# Patient Record
Sex: Female | Born: 1970 | Race: Black or African American | Hispanic: No | Marital: Married | Smoking: Never smoker
Health system: Southern US, Community
[De-identification: ages and names within clinical notes are randomized; demographics above are authoritative.]

## PROBLEM LIST (undated history)

## (undated) DIAGNOSIS — D649 Anemia, unspecified: Secondary | ICD-10-CM

## (undated) DIAGNOSIS — B20 Human immunodeficiency virus [HIV] disease: Secondary | ICD-10-CM

## (undated) DIAGNOSIS — Z5189 Encounter for other specified aftercare: Secondary | ICD-10-CM

## (undated) DIAGNOSIS — E119 Type 2 diabetes mellitus without complications: Secondary | ICD-10-CM

## (undated) DIAGNOSIS — K219 Gastro-esophageal reflux disease without esophagitis: Secondary | ICD-10-CM

## (undated) HISTORY — DX: Type 2 diabetes mellitus without complications: E11.9

---

## 2001-11-21 ENCOUNTER — Emergency Department (HOSPITAL_COMMUNITY): Admission: EM | Admit: 2001-11-21 | Discharge: 2001-11-21 | Payer: Self-pay | Admitting: *Deleted

## 2002-12-28 ENCOUNTER — Encounter: Payer: Self-pay | Admitting: *Deleted

## 2002-12-28 ENCOUNTER — Inpatient Hospital Stay (HOSPITAL_COMMUNITY): Admission: AD | Admit: 2002-12-28 | Discharge: 2002-12-28 | Payer: Self-pay | Admitting: *Deleted

## 2003-01-08 ENCOUNTER — Inpatient Hospital Stay (HOSPITAL_COMMUNITY): Admission: AD | Admit: 2003-01-08 | Discharge: 2003-01-08 | Payer: Self-pay | Admitting: Obstetrics & Gynecology

## 2003-01-13 ENCOUNTER — Inpatient Hospital Stay (HOSPITAL_COMMUNITY): Admission: AD | Admit: 2003-01-13 | Discharge: 2003-01-13 | Payer: Self-pay | Admitting: *Deleted

## 2003-01-13 ENCOUNTER — Encounter: Payer: Self-pay | Admitting: *Deleted

## 2003-01-19 ENCOUNTER — Inpatient Hospital Stay (HOSPITAL_COMMUNITY): Admission: AD | Admit: 2003-01-19 | Discharge: 2003-01-23 | Payer: Self-pay | Admitting: Obstetrics and Gynecology

## 2003-01-26 ENCOUNTER — Inpatient Hospital Stay (HOSPITAL_COMMUNITY): Admission: AD | Admit: 2003-01-26 | Discharge: 2003-01-29 | Payer: Self-pay | Admitting: Obstetrics and Gynecology

## 2003-02-11 ENCOUNTER — Inpatient Hospital Stay (HOSPITAL_COMMUNITY): Admission: AD | Admit: 2003-02-11 | Discharge: 2003-02-12 | Payer: Self-pay | Admitting: Family Medicine

## 2003-02-23 ENCOUNTER — Encounter: Admission: RE | Admit: 2003-02-23 | Discharge: 2003-02-23 | Payer: Self-pay | Admitting: *Deleted

## 2003-03-09 ENCOUNTER — Ambulatory Visit (HOSPITAL_COMMUNITY): Admission: RE | Admit: 2003-03-09 | Discharge: 2003-03-09 | Payer: Self-pay | Admitting: *Deleted

## 2003-03-09 ENCOUNTER — Other Ambulatory Visit: Admission: RE | Admit: 2003-03-09 | Discharge: 2003-03-09 | Payer: Self-pay | Admitting: Obstetrics & Gynecology

## 2003-03-09 ENCOUNTER — Encounter: Admission: RE | Admit: 2003-03-09 | Discharge: 2003-03-09 | Payer: Self-pay | Admitting: *Deleted

## 2003-03-09 ENCOUNTER — Encounter (INDEPENDENT_AMBULATORY_CARE_PROVIDER_SITE_OTHER): Payer: Self-pay

## 2003-03-23 ENCOUNTER — Encounter: Admission: RE | Admit: 2003-03-23 | Discharge: 2003-03-23 | Payer: Self-pay | Admitting: *Deleted

## 2003-04-06 ENCOUNTER — Encounter: Admission: RE | Admit: 2003-04-06 | Discharge: 2003-04-06 | Payer: Self-pay | Admitting: *Deleted

## 2003-05-04 ENCOUNTER — Encounter: Admission: RE | Admit: 2003-05-04 | Discharge: 2003-05-04 | Payer: Self-pay | Admitting: *Deleted

## 2003-05-04 ENCOUNTER — Ambulatory Visit (HOSPITAL_COMMUNITY): Admission: RE | Admit: 2003-05-04 | Discharge: 2003-05-04 | Payer: Self-pay | Admitting: *Deleted

## 2003-05-18 ENCOUNTER — Encounter: Admission: RE | Admit: 2003-05-18 | Discharge: 2003-05-18 | Payer: Self-pay | Admitting: *Deleted

## 2003-05-18 ENCOUNTER — Inpatient Hospital Stay (HOSPITAL_COMMUNITY): Admission: AD | Admit: 2003-05-18 | Discharge: 2003-05-18 | Payer: Self-pay | Admitting: Obstetrics and Gynecology

## 2003-06-01 ENCOUNTER — Encounter: Admission: RE | Admit: 2003-06-01 | Discharge: 2003-06-01 | Payer: Self-pay | Admitting: *Deleted

## 2003-06-01 ENCOUNTER — Ambulatory Visit (HOSPITAL_COMMUNITY): Admission: RE | Admit: 2003-06-01 | Discharge: 2003-06-01 | Payer: Self-pay | Admitting: *Deleted

## 2003-06-15 ENCOUNTER — Encounter: Admission: RE | Admit: 2003-06-15 | Discharge: 2003-06-15 | Payer: Self-pay | Admitting: *Deleted

## 2003-07-06 ENCOUNTER — Encounter: Admission: RE | Admit: 2003-07-06 | Discharge: 2003-07-06 | Payer: Self-pay | Admitting: Obstetrics & Gynecology

## 2003-07-13 ENCOUNTER — Encounter: Admission: RE | Admit: 2003-07-13 | Discharge: 2003-07-13 | Payer: Self-pay | Admitting: Obstetrics & Gynecology

## 2003-07-20 ENCOUNTER — Encounter: Admission: RE | Admit: 2003-07-20 | Discharge: 2003-07-20 | Payer: Self-pay | Admitting: *Deleted

## 2003-07-27 ENCOUNTER — Encounter: Admission: RE | Admit: 2003-07-27 | Discharge: 2003-07-27 | Payer: Self-pay | Admitting: *Deleted

## 2003-07-28 ENCOUNTER — Encounter: Admission: RE | Admit: 2003-07-28 | Discharge: 2003-07-28 | Payer: Self-pay | Admitting: *Deleted

## 2003-08-03 ENCOUNTER — Encounter: Admission: RE | Admit: 2003-08-03 | Discharge: 2003-08-03 | Payer: Self-pay | Admitting: Obstetrics & Gynecology

## 2003-08-05 ENCOUNTER — Encounter (INDEPENDENT_AMBULATORY_CARE_PROVIDER_SITE_OTHER): Payer: Self-pay | Admitting: Specialist

## 2003-08-05 ENCOUNTER — Inpatient Hospital Stay (HOSPITAL_COMMUNITY): Admission: AD | Admit: 2003-08-05 | Discharge: 2003-08-08 | Payer: Self-pay | Admitting: *Deleted

## 2003-08-15 ENCOUNTER — Inpatient Hospital Stay (HOSPITAL_COMMUNITY): Admission: AD | Admit: 2003-08-15 | Discharge: 2003-08-15 | Payer: Self-pay | Admitting: Obstetrics & Gynecology

## 2004-06-29 ENCOUNTER — Inpatient Hospital Stay (HOSPITAL_COMMUNITY): Admission: AD | Admit: 2004-06-29 | Discharge: 2004-06-29 | Payer: Self-pay | Admitting: Family Medicine

## 2004-07-07 IMAGING — US US OB FOLLOW-UP
1 series · 7 of 7 positions shown · non-contrast
Comparison: none

CLINICAL DATA: History of IUGR.  Assess growth.  G2 P1.  Patient is 30 weeks 2 days by LMP dating.

[Series 1: unknown · 0.32mm/px · 7 of 7 slices shown]
[im 1/7]
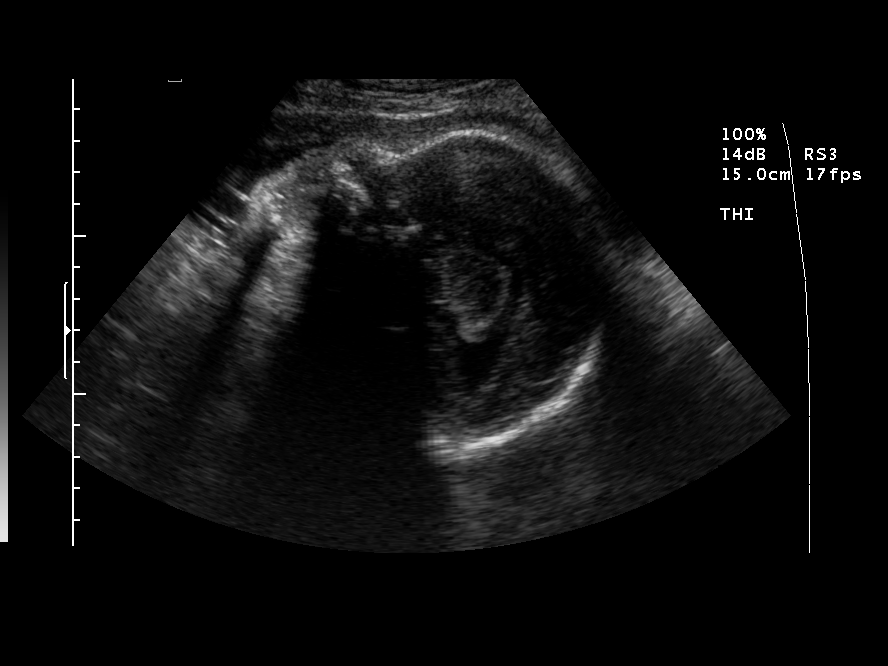
[im 2/7]
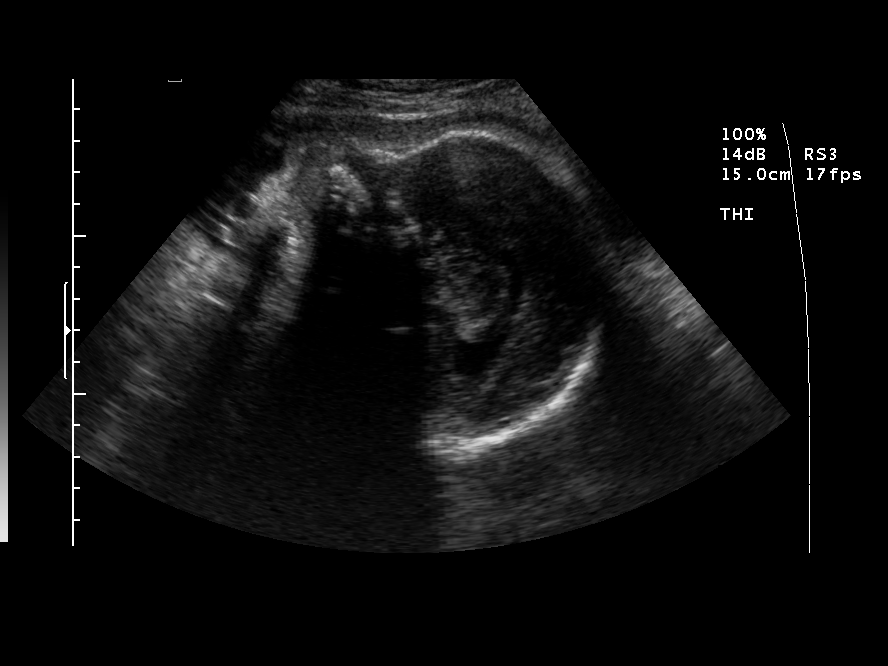
[im 3/7]
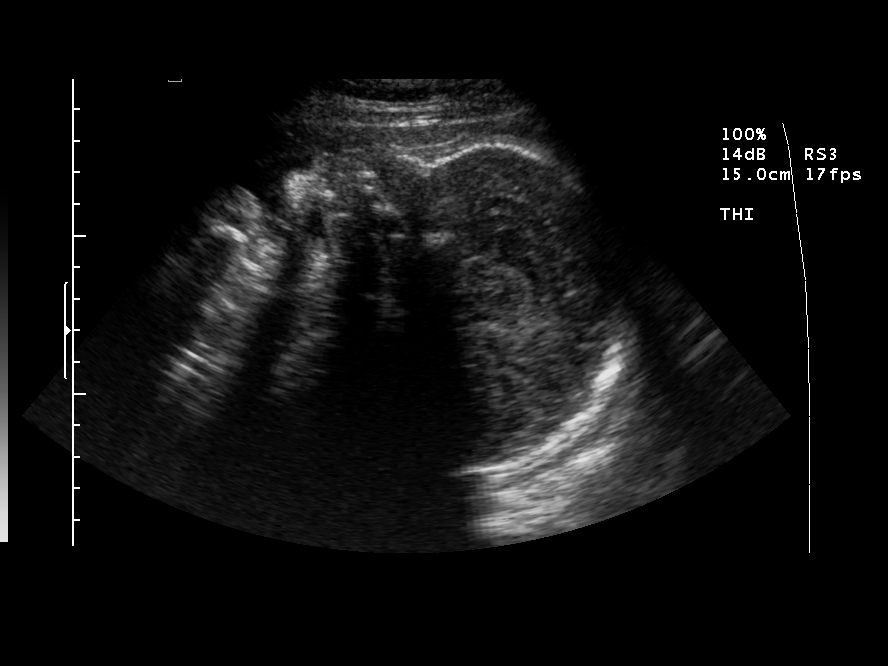
[im 4/7]
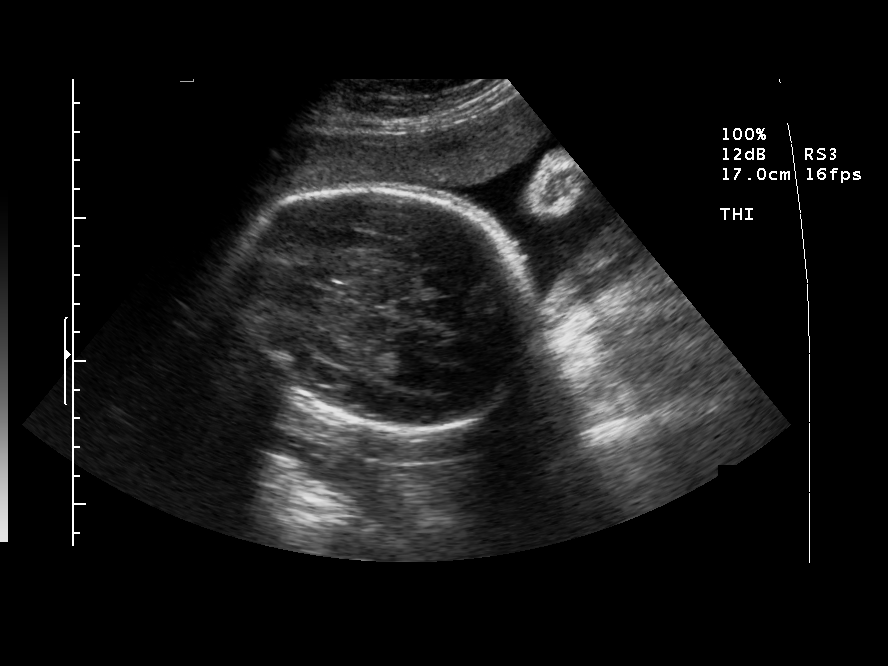
[im 5/7]
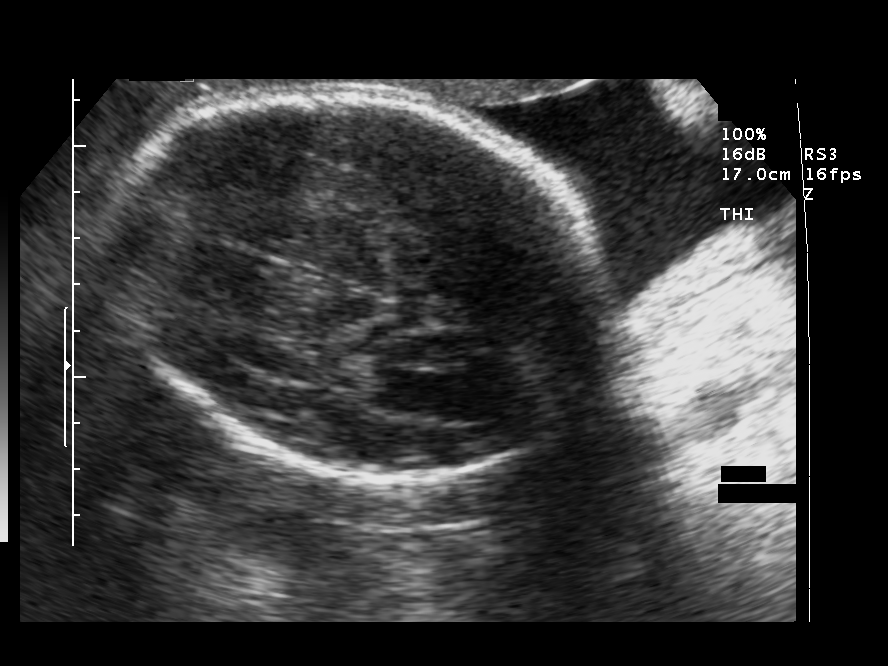
[im 6/7]
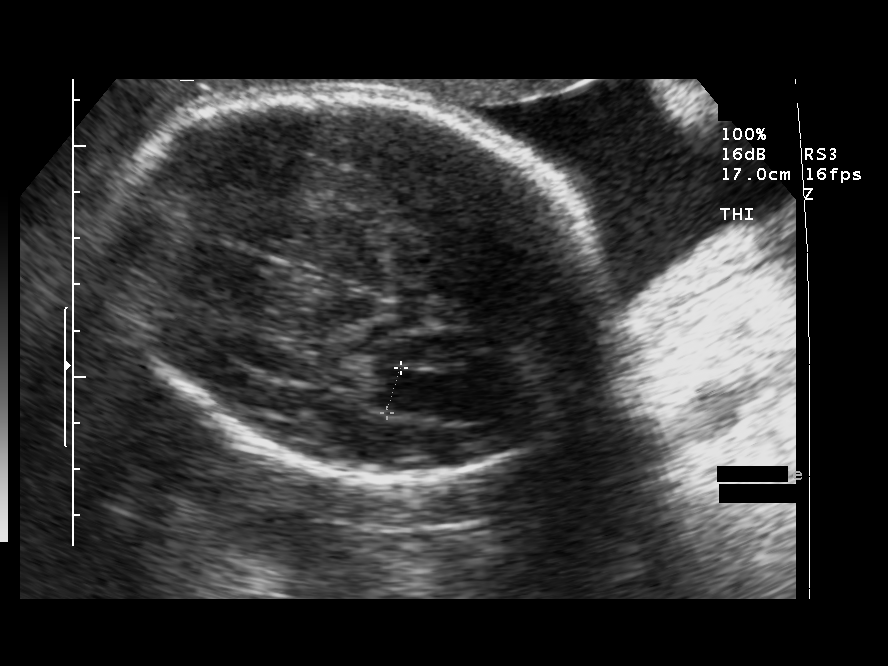
[im 7/7]
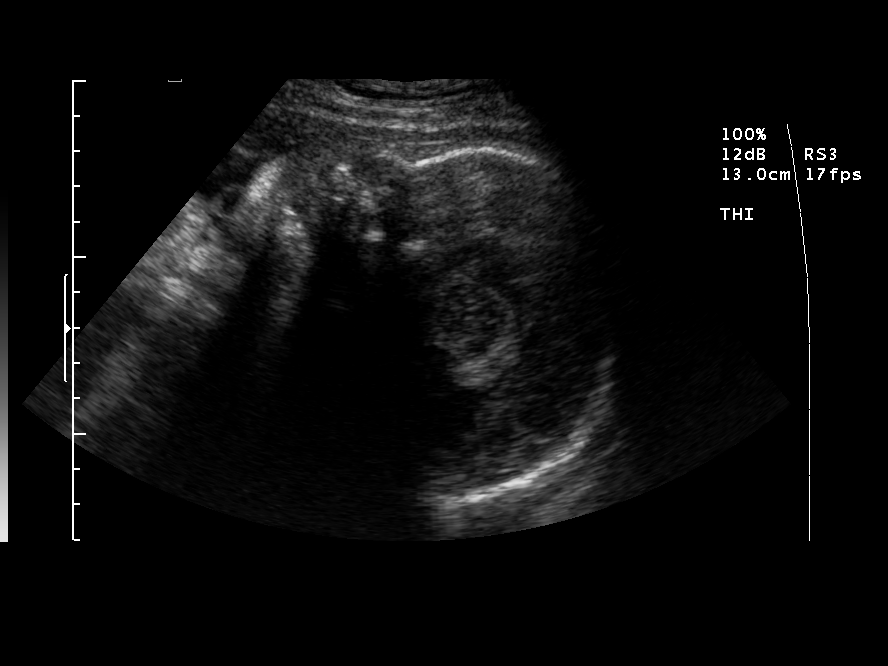

[7 of 7 positions shown; findings below may reference images not displayed]

OBSTETRICAL ULTRASOUND RE-EVALUATION
 Number of Fetuses:  1
 Heart Rate:  145
 Movement:  Yes
 Breathing:  No
 Presentation:  Cephalic
 Placental Location:  Anterior
 Grade:  I
 Previa:  No
 Amniotic Fluid (subjective):  Normal
 Amniotic Fluid (objective):  19.9 cm AFI (5th -95th%ile =   9.0 – 23.4 cm for 30 wks)

 FETAL BIOMETRY
 BPD:  8.1 cm   32 w 4 d
 HC:  30.7 cm   31 w 2 d
 AC:  27.3 cm   31 w 3 d
 FL:  6.3 cm   32 w 3 d

 Mean GA:  32 w 5 d
 Assigned GA:  30 w 2 d (LMP)

 EFW:  2324 g (H) Greater than 95th (95th = 3801 g) For 30 wks

 FETAL ANATOMY
 Lateral Ventricles:  Visualized 
 Thalami/CSP:  Visualized 
 Posterior Fossa:  Visualized 
 Nuchal Region:  Previously seen 
 Spine:  Previously seen 
 4 Chamber Heart on Left:  Previously seen 
 Stomach on Left:  Visualized 
 3 Vessel Cord:  Previously seen 
 Cord Insertion Site:  Previously seen 
 Kidneys:  Visualized 
 Bladder:  Visualized 
 Extremities:  Previously seen 

 ADDITIONAL ANATOMY VISUALIZED:  Diaphragm
 Comment:  The atrium of the lateral ventricle measures between 9.7 and 10.2 mm.  The upper limit of normal is 10 mm.  No other abnormality is noted.  The profile could not be well seen today.  

 MATERNAL FINDINGS
 Cervix:  3.3 cm Transabdominally
IMPRESSION: Single living intrauterine fetus in cephalic presentation.  Patient is 30 weeks 2 days by LMP dating and measures 32 weeks 5 days today with a fetal weight of 2324 grams, falling above the 95th percentile for 30 weeks.  Findings raise concern for macrosomia.
 Normal amniotic fluid volume with AFI 19.9 cm.  
 The lateral ventricle continues to measure at the upper limit of normal.  No other definite abnormality identified.

## 2004-08-27 ENCOUNTER — Encounter: Admission: RE | Admit: 2004-08-27 | Discharge: 2004-08-27 | Payer: Self-pay | Admitting: Specialist

## 2007-08-19 ENCOUNTER — Ambulatory Visit: Payer: Self-pay | Admitting: Obstetrics & Gynecology

## 2007-08-19 ENCOUNTER — Encounter: Payer: Self-pay | Admitting: Family

## 2007-08-20 ENCOUNTER — Ambulatory Visit (HOSPITAL_COMMUNITY): Admission: RE | Admit: 2007-08-20 | Discharge: 2007-08-20 | Payer: Self-pay | Admitting: Obstetrics & Gynecology

## 2007-08-26 ENCOUNTER — Ambulatory Visit: Payer: Self-pay | Admitting: Obstetrics & Gynecology

## 2007-08-29 ENCOUNTER — Ambulatory Visit: Payer: Self-pay | Admitting: Family

## 2007-08-29 ENCOUNTER — Inpatient Hospital Stay (HOSPITAL_COMMUNITY): Admission: AD | Admit: 2007-08-29 | Discharge: 2007-08-31 | Payer: Self-pay | Admitting: Obstetrics and Gynecology

## 2007-09-04 ENCOUNTER — Ambulatory Visit: Payer: Self-pay | Admitting: Family Medicine

## 2007-10-31 ENCOUNTER — Emergency Department (HOSPITAL_COMMUNITY): Admission: EM | Admit: 2007-10-31 | Discharge: 2007-10-31 | Payer: Self-pay | Admitting: Emergency Medicine

## 2009-02-16 ENCOUNTER — Encounter: Payer: Self-pay | Admitting: *Deleted

## 2009-05-29 ENCOUNTER — Encounter: Payer: Self-pay | Admitting: *Deleted

## 2009-08-10 ENCOUNTER — Encounter: Payer: Self-pay | Admitting: *Deleted

## 2010-05-22 NOTE — Miscellaneous (Signed)
Summary: NPI  Clinical Lists Changes Janey from [facility] called for an NPI for this pt's ID appt next wk. told her I cannot give it as she is not a pt here & we have no records on her.Golden Circle RN  May 29, 2009 3:53 PM

## 2010-05-22 NOTE — Miscellaneous (Signed)
Summary: [facility] wanted NPI  Clinical Lists Changes denied again. this is the third time they have called for this. each time I have asked them to tell their pt to get our name off her medicaid card. Wille Celeste [phone] was the caller.Golden Circle RN  August 10, 2009 12:07 PM

## 2010-09-04 NOTE — Discharge Summary (Signed)
Kathy Reyes, Kathy Reyes NO.:  [id number]   MEDICAL RECORD NO.:  [id number]          PATIENT TYPE:  INP   LOCATION:  9119                          [facility]ILITY[facility]   PHYSICIAN:  Karlton Lemon, MD      DATE OF BIRTH:  August 03, 1970   DATE OF ADMISSION:  08/29/2007  DATE OF DISCHARGE:  08/31/2007                               DISCHARGE SUMMARY   ADMISSION DIAGNOSES:  1. Intrauterine pregnancy at 39 weeks 5 days' gestation.  2. Labor.  3. Group B Streptococcus negative.  4. Human immunodeficiency virus unknown.   DISCHARGE DIAGNOSES:  1. Postpartum day #2, vaginal birth after cesarean of a viable infant      female weighing 6 pounds 10 ounces, Apgars 9 at one minute and 9 at      five minutes.  2. Anemia.  3. Rapid human immunodeficiency virus reactive.  4. Western blot pending.   PROCEDURES:  None.   COMPLICATIONS:  None.   CONSULTATIONS:  None.   PERTINENT LABORATORY FINDINGS:  The patient had an admission CBC showing  white blood cell count of 10.1, hemoglobin 9.8, and platelets 241,000.  Initial rapid HIV screen was reactive.  RPR nonreactive.  A repeat HIV  screen 4-1/2 hours later was again reactive.  White blood cell count on  postpartum day #1 was 12.4, hemoglobin was 8.0, hematocrit 25.0, and  platelets 178.   BRIEF PERTINENT ADMISSION HISTORY:  Ms. Boal is a 40 year old  gravida 3, para 2-0-0-2, presenting at 3 weeks 5 days' gestation in  labor.  She was admitted with cervix dilated to 5 cm, 90% effaced, -2  station, and her membranes were ruptured.   HOSPITAL COURSE:  The patient was admitted.  Labs were drawn and  reactive HIV was noted.  This was repeated 4-1/2 hours later, was once  again reactive.  She did progress to a delivery of a viable infant  female weighing 6 pounds 10 ounces, Apgars 9 at one minute and 9 at five  minutes.  Her postpartum course was uneventful, though her hemoglobin  did drop to 8.0.  She was asymptomatic.   Western blot was drawn,  secondary to a reactive rapid HIV, but was pending at time of discharge.   PHYSICAL EXAMINATION:  Normal.  Vitals were stable.   She was asymptomatic despite having a hemoglobin of 8.0.  She was stable  and ready for discharge.  She was not breastfeeding the baby due to  reactive HIV rapid screen.  She has been discharged home.   DISCHARGE STATUS:  Stable.   DISCHARGE MEDICATIONS:  1. Continue prenatal vitamins one daily.  2. Colace 100 mg one tablet by mouth twice daily.  3. Ferrous sulfate 325 mg one tablet by mouth twice daily.  4. Motrin 600 mg one tablet by mouth every six hours as needed for      pain.   DISCHARGE INSTRUCTIONS:  1. Discharged to home.  2. No sexual activity x6 weeks.  3. No breastfeeding.  4. Regular diet.  5. The patient is to follow at the [facility] on Sep 09, 2007, at 3:15 in the afternoon to discuss the results of her      Western blot test.  6. She [facility] a 6-week      postpartum examination.      Karlton Lemon, MD  Electronically Signed     NS/MEDQ  D:  08/31/2007  T:  09/01/2007  Job:  [id number]

## 2010-09-07 NOTE — Discharge Summary (Signed)
NAME:  Kathy Reyes, Kathy Reyes NO.:  [id number]   MEDICAL RECORD NO.:  [id number]                   PATIENT TYPE:  INP   LOCATION:  9327                                 [facility]   PHYSICIAN:  Phil D. Okey Dupre, M.D.                  DATE OF BIRTH:  1970/09/15   DATE OF ADMISSION:  01/26/2003  DATE OF DISCHARGE:  01/29/2003                                 DISCHARGE SUMMARY   ADMISSION DIAGNOSES:  1. A 12 1/7 week intrauterine pregnancy.  2. Hyperemesis with dehydration.   DISCHARGE DIAGNOSES:  1. A 12 4/7 week intrauterine pregnancy.  2. Hyperemesis, improved.  3. Rehydration successful.   HISTORY:  This is a 40 year old G2, P1-0-1 who presented at 74 1/7 weeks by  her last menstrual period which was confirmed by ultrasound.  She had  recently been discharged from the [facility] on January 23, 2003, after  being admitted also for hyperemesis.  She had been doing well but her nausea  and vomiting did not abate with her medications over the three days prior to  admission.  She was not tolerating any food or fluids for more than a day.  She was also having abdominal pain which she attributed to the persistent  vomiting.   MEDICATIONS:  1. Potassium chloride 20 mEq.  2. Vitamin B6.  3. Over-the-counter sleep aid, Unisom.   ALLERGIES:  She had no allergies.   PAST HISTORY:  Her past history was significant for a spontaneous vaginal  delivery at term.   GYN:  Negative STIs.   MEDICAL:  Noncontributory.   SURGERIES:  None.   SOCIAL HISTORY:  No tobacco, alcohol, or drug use.   FAMILY HISTORY:  Noncontributory.   ADMISSION PHYSICAL EXAMINATION:  VITAL SIGNS:  Temp was 98.3, pulse 88,  respirations 18, and BP 123/77.  GENERAL:  She appeared lethargic.  HEENT:  Significant for dry mucous membranes.  LUNGS:  Clear.  ABDOMEN:  Quiet bowel sounds.  Soft and mildly tender in the lower quadrant.  HEART:  Regular rate and rhythm without murmur.  BACK:  She had no CVA tenderness.  SKIN:  Warm and dry.  GENITALIA:  Exam deferred.   LABORATORY DATA:  Her urinalysis was significant for a specific gravity  greater than 1.030, ketones greater than 80, trace hemoglobin, moderate  bilirubin, positive ICTO, 2 urobilinogen, and 100 protein.  She had negative  nitrite, negative LE.   HOSPITAL COURSE:  In consultation with Dr. Okey Dupre, the patient was admitted  for IV rehydration.  She got a metabolic panel and a CBC.  Hemoglobin was  11.8, hematocrit 35, platelets 242,000, white count 6.7.  Her electrolytes  were significant only for a sodium that was 132.  BUN was 5.  Her AST was  mildly elevated at 39.  ALT was mildly elevated at 56.  Amylase and lipase  were normal.  Urinalysis was  as above.  She was given IV lactated Ringers  with Zofran 4 mg IV every 6 hours after having had an 8-mg load of Zofran.  She also had multivitamins added to her IV fluid bag and she had a social  service consult to begin discharge planning and arrange for home health  nursing care with IVs.   On hospital day one, she began to feel better and was having very little  emesis.  Vital signs remained stable.  She was continued on Zofran and, on  hospital day three, she was tolerating soft solids and was not having nausea  and vomiting.  She had plans for home care and did have help at home.  She  was afebrile with vital signs stable.  Fetal heart rate was 150.  Her  abdomen was soft and nontender.  The decision was made to discharge on  Zofran and general measures for nausea and vomiting.  Her followup was to be  at one week at [facility].   MEDICATION:  Zofran 4 mg IV q.6h. with advanced home care doing her IV and  home visitation.   DISPOSITION:  The patient was discharged home in good condition.     Deirdre Christy Gentles, C.N.M.                       Phil D. Okey Dupre, M.D.    DP/MEDQ  D:  02/21/2003  T:  02/22/2003  Job:  [id number]

## 2010-09-07 NOTE — Discharge Summary (Signed)
NAMETHULA, STEWART NO.:  [id number]   MEDICAL RECORD NO.:  [id number]                   PATIENT TYPE:  INP   LOCATION:  9327                                 [facility]   PHYSICIAN:  Franchot Mimes, MD                   DATE OF BIRTH:  02/14/1971   DATE OF ADMISSION:  01/19/2003  DATE OF DISCHARGE:  01/23/2003                                 DISCHARGE SUMMARY   DISCHARGE DIAGNOSES:  1. Hyperemesis.  2. Dehydration.   HISTORY OF PRESENT ILLNESS:  The patient is a 40 year old [place] female  who is a G2, P1 who presented at 11 weeks and 2 days estimated gestational  age with three to four day history of emesis.  Hyperemesis has been a  persistent problem with her throughout this pregnancy.   PHYSICAL EXAMINATION:  VITAL SIGNS:  Temperature 98.1, pulse 91, blood  pressure 114/71.  Remainder of physical examination is normal.  Please see  admission H&P for full admission details.   HOSPITAL COURSE:  Problem 1 - HYPEREMESIS:  The patient was given a 1000 mL  bolus of lactated Ringer's as well as the Zofran 8 mg IV and was made n.p.o.  The patient has been maintained on D5 LR 125 mL/hour and the Zofran was  decreased to 2-4 mg q.6h. IV.  The patient's diet was advanced two days  following admission as well as supplemented with potassium due to a low  potassium on laboratory work.  The patient had only minimal improvement with  Zofran.  She has been changed to Reglan with Unisom and B6 as her diet was  advanced.  Following the change to Unisom and B6 the patient had marked  improvement in her ability to take p.o. as well as decreased nausea and  vomiting.  The patient's IV fluids were discontinued the day of discharge.  The patient had no complaints of nausea and vomiting on day of discharge.   LABORATORY DATA:  Urinalysis from January 19, 2003 showed a specific  gravity of greater than 1.03, ketones greater than 80, protein 100, and  granular  casts.  A CBC obtained on January 20, 2003 was within normal  limits.  Basic metabolic panel revealed a potassium of 2.9, BUN of 3,  creatinine of 0.5.  The potassium dropped to 2.8 on October 1 and then later  improved to 3.0 later in the day.  On October 2 potassium 2.9.  Thyroid  function tests were within normal limits.   DISCHARGE MEDICATIONS:  1. The patient was discharged with Unisom one tablet p.o. daily.  2. Vitamin B6 50 mg p.o. daily.  3. Reglan 10 mg p.o. b.i.d.   DISCHARGE INSTRUCTIONS:  The patient was instructed to continue a soft,  bland diet until symptoms completely resolved.    DISCHARGE DESTINATION:  The patient was discharged home.   FOLLOWUP APPOINTMENTS:  The patient was  instructed to keep all of her  regular OB follow-up visits.                                               Franchot Mimes, MD    TV/MEDQ  D:  02/28/2003  T:  03/01/2003  Job:  [id number]

## 2010-09-07 NOTE — Discharge Summary (Signed)
NAMEJOSSALYN, Kathy Reyes NO.:  [id number]   MEDICAL RECORD NO.:  [id number]                   PATIENT TYPE:  INP   LOCATION:  9104                                 [facility]   PHYSICIAN:  Phil D. Okey Dupre, M.D.                  DATE OF BIRTH:  1970/09/11   DATE OF ADMISSION:  08/05/2003  DATE OF DISCHARGE:  08/08/2003                                 DISCHARGE SUMMARY   LABORATORY DATA:  1. White blood cell count 11.4, hemoglobin 6.2, hematocrit 20.2, platelet     count 280 on August 07, 2003.  2. Hemoglobin 6.2 on August 06, 2003.  3. Hemoglobin 8.0 in January.  4. A negative, RhoGAM given per protocol.   STUDIES:  None.   HOSPITAL COURSE:  This is a 40 year old G2 P1-0-0-1 who presented at 14 and  3 weeks to the MAU who was in the active phase of labor, had her membranes  ruptured, and had to have a cesarean section due to some nonreassuring fetal  heart tones.  She tolerated the C-section well.  Her hemoglobin went down to  6.2 but throughout her stay she has been asymptomatic.  Her heart rate has  been in the 80s and blood pressure has been stable.  Of note, her hemoglobin  was 8.0 in January pre C-section and she states that she has been taking  iron pills for anemia for many months now.   She also had gestational diabetes which was well controlled throughout her  stay.  Her postpartum stay was without problem.  She ate, drank, urinated,  and passed gas without problem.  Her pain was controlled with pain  medications.   She will be breastfeeding and will be getting a Depo shot in the next week  or two for contraception.  She did not want a Depo shot before discharge.  A  circumcision was performed on her son.  She initially said she wanted a  Jewish style circumcision.  I talked to her about where or not she wanted a  mohel to perform it and in the traditional manner 8 days after birth and  with prayers and ceremony.  She said that she did not  want any of this but  simply wanted a circumcision which took off enough foreskin to expose the  head of the penis and that it was fine for a non-Jewish physician to do  this.  I discussed risks and benefits of the procedure with her and told her  that this was not a medically necessary procedure but she and her husband  both wanted it done.   FOLLOW-UP VISITS:  One week in the MAU for staple removal, 6 weeks in the  [facility] for postpartum care.   MEDICATIONS UPON DISCHARGE:  1. Percocet 5/325 one p.o. q.4h. p.r.n. pain.  2. Ibuprofen 600 mg p.o. q.6h. p.r.n. pain.  3. Iron sulfate 325  mg p.o. b.i.d. for anemia.  4. Prenatal vitamins one p.o. daily.     Miles Costain, M.D.                          Phil D. Okey Dupre, M.D.   DY/MEDQ  D:  08/08/2003  T:  08/09/2003  Job:  [id number]

## 2010-09-07 NOTE — Op Note (Signed)
NAMETATUMN, CORBRIDGE NO.:  [id number]   MEDICAL RECORD NO.:  [id number]                   PATIENT TYPE:  INP   LOCATION:  9104                                 [facility]   PHYSICIAN:  Phil D. Okey Dupre, M.D.                  DATE OF BIRTH:  20-Jan-1971   DATE OF PROCEDURE:  08/05/2003  DATE OF DISCHARGE:                                 OPERATIVE REPORT   PROCEDURE:  Low transverse cesarean section.   PREOPERATIVE DIAGNOSIS:  Fetal bradycardia and cephalopelvic disproportion.   POSTOPERATIVE DIAGNOSIS:  Fetal bradycardia and cephalopelvic disproportion.   ANESTHESIA:  Epidural.   SURGEON:  Phil D. Okey Dupre, M.D.   FIRST ASSISTANT:  Nilda Simmer, M.D.   ESTIMATED BLOOD LOSS:  800 mL.   POSTOPERATIVE CONDITION:  Satisfactory.   The baby was a female weighing 8 pounds 6 ounces with a 9/9 Apgar.   DESCRIPTION OF PROCEDURE:  Under satisfactory epidural anesthesia with the  patient in dorsal supine position and Foley catheter in urinary bladder the  abdomen was prepped and draped in the usual sterile manner and entered  through a transverse suprapubic incision situated 3 cm above the symphysis  pubis and extending for a total length of 14 cm and opened in the typical  Pfannenstiel fashion.  Upon entering the peritoneal cavity the visceral  peritoneum and the anterior surface of the uterus was opened transversely by  sharp dissection.  The bladder pushed away from the lower uterine segment,  was entered by sharp and blunt dissection and from an LOT presentation the  baby was easily delivered, cord doubly clamped and divided, the baby handed  to the pediatrician, and samples taken from the cord for analysis.  The baby  was suctioned with a bulb suction prior to being handed to the pediatrician.  The placenta was manually removed and the uterus explored and closed with a  continuous running locked 0 Vicryl on a atraumatic needled.  Three figure-of-  eight  sutures in addition to that were used for oozing areas on the lower  segment to control those, and tape, instrument, sponge, and needle count  were recorded correct at that point and the fascia was closed with a  continuous running 0 Vicryl on an atraumatic needle.  Subcutaneous bleeders  were controlled with hot cautery, dry sterile dressing was applied.  The  patient was transferred to the recovery room in satisfactory condition,  having tolerated the procedure well.  Tape, instrument, sponge, and needle  count reported as correct at the end of the procedure.  Total blood loss  during the procedure approximately 800 mL.  The baby was a female weighing 8  pounds 6 ounces with 9/9 Apgar.  Phil D. Okey Dupre, M.D.   PDR/MEDQ  D:  08/05/2003  T:  08/08/2003  Job:  [id number]

## 2011-01-15 LAB — POCT URINALYSIS DIP (DEVICE)
Bilirubin Urine: NEGATIVE
Glucose, UA: NEGATIVE
Hgb urine dipstick: NEGATIVE
Ketones, ur: NEGATIVE
Nitrite: NEGATIVE
Operator id: 120861
Protein, ur: NEGATIVE
Specific Gravity, Urine: <=1.005
Urobilinogen, UA: 0.2
pH: 7

## 2011-01-17 LAB — RAPID STREP SCREEN (MED CTR MEBANE ONLY): Streptococcus, Group A Screen (Direct): NEGATIVE

## 2011-11-29 ENCOUNTER — Ambulatory Visit: Payer: Self-pay | Admitting: Family Medicine

## 2011-11-29 VITALS — BP 118/78 | HR 91 | Temp 97.6°F | Resp 16 | Ht 64.0 in | Wt 162.2 lb

## 2011-11-29 DIAGNOSIS — N390 Urinary tract infection, site not specified: Secondary | ICD-10-CM

## 2011-11-29 LAB — POCT URINALYSIS DIPSTICK
Bilirubin, UA: NEGATIVE
Glucose, UA: NEGATIVE
Ketones, UA: NEGATIVE
Nitrite, UA: NEGATIVE
Protein, UA: NEGATIVE
Spec Grav, UA: <=1.005
Urobilinogen, UA: 0.2
pH, UA: 6.5

## 2011-11-29 LAB — POCT UA - MICROSCOPIC ONLY
Casts, Ur, LPF, POC: NEGATIVE
Crystals, Ur, HPF, POC: NEGATIVE
Mucus, UA: NEGATIVE

## 2011-11-29 MED ORDER — NITROFURANTOIN MACROCRYSTAL 100 MG PO CAPS: 100.0000 mg | ORAL_CAPSULE | Freq: Four times a day (QID) | ORAL | Status: AC

## 2011-11-29 NOTE — Progress Notes (Signed)
Symptomatic:  41 yo woman with 2 days of urinary frequency. She had this 1 month  Ago but symptoms resolved spontaneously No back pain, nausea, or blood in urine  Objective:  NAD Results for orders placed in visit on 11/29/11  POCT URINALYSIS DIPSTICK      Component Value Range   Color, UA light yellow     Clarity, UA cloudy     Glucose, UA neg     Bilirubin, UA neg     Ketones, UA neg     Spec Grav, UA <=1.005     Blood, UA large     pH, UA 6.5     Protein, UA neg     Urobilinogen, UA 0.2     Nitrite, UA neg     Leukocytes, UA large (3+)    POCT UA - MICROSCOPIC ONLY      Component Value Range   WBC, Ur, HPF, POC tntc     RBC, urine, microscopic 0-1     Bacteria, U Microscopic trace     Mucus, UA neg     Epithelial cells, urine per micros 0-1     Crystals, Ur, HPF, POC neg     Casts, Ur, LPF, POC neg     Yeast, UA eng

## 2011-12-01 LAB — URINE CULTURE: Colony Count: 3000

## 2012-05-18 ENCOUNTER — Encounter (HOSPITAL_BASED_OUTPATIENT_CLINIC_OR_DEPARTMENT_OTHER): Payer: Self-pay | Admitting: *Deleted

## 2012-05-18 ENCOUNTER — Emergency Department (HOSPITAL_BASED_OUTPATIENT_CLINIC_OR_DEPARTMENT_OTHER)
Admission: EM | Admit: 2012-05-18 | Discharge: 2012-05-19 | Disposition: A | Discharge status: Home / Self Care | Payer: Medicaid Other | Attending: Emergency Medicine | Admitting: Emergency Medicine

## 2012-05-18 DIAGNOSIS — R112 Nausea with vomiting, unspecified: Secondary | ICD-10-CM | POA: Insufficient documentation

## 2012-05-18 DIAGNOSIS — Z862 Personal history of diseases of the blood and blood-forming organs and certain disorders involving the immune mechanism: Secondary | ICD-10-CM | POA: Insufficient documentation

## 2012-05-18 DIAGNOSIS — Z3202 Encounter for pregnancy test, result negative: Secondary | ICD-10-CM | POA: Insufficient documentation

## 2012-05-18 DIAGNOSIS — R42 Dizziness and giddiness: Secondary | ICD-10-CM

## 2012-05-18 HISTORY — DX: Anemia, unspecified: D64.9

## 2012-05-18 LAB — URINALYSIS, ROUTINE W REFLEX MICROSCOPIC
Bilirubin Urine: NEGATIVE
Glucose, UA: NEGATIVE mg/dL
Hgb urine dipstick: NEGATIVE
Ketones, ur: NEGATIVE mg/dL
Leukocytes, UA: NEGATIVE
Nitrite: NEGATIVE
Protein, ur: NEGATIVE mg/dL
Specific Gravity, Urine: 1.017 (ref 1.005–1.030)
Urobilinogen, UA: 1 mg/dL (ref 0.0–1.0)
pH: 6.5 (ref 5.0–8.0)

## 2012-05-18 LAB — CBC
HCT: 34.6 % — ABNORMAL LOW (ref 36.0–46.0)
Hemoglobin: 11.1 g/dL — ABNORMAL LOW (ref 12.0–15.0)
MCH: 24.9 pg — ABNORMAL LOW (ref 26.0–34.0)
MCHC: 32.1 g/dL (ref 30.0–36.0)
MCV: 77.8 fL — ABNORMAL LOW (ref 78.0–100.0)
Platelets: 329 10*3/uL (ref 150–400)
RBC: 4.45 MIL/uL (ref 3.87–5.11)
RDW: 18.3 % — ABNORMAL HIGH (ref 11.5–15.5)
WBC: 6.5 10*3/uL (ref 4.0–10.5)

## 2012-05-18 LAB — PREGNANCY, URINE: Preg Test, Ur: NEGATIVE

## 2012-05-18 MED ORDER — MECLIZINE HCL 25 MG PO TABS: 25.0000 mg | ORAL_TABLET | Freq: Three times a day (TID) | ORAL | Status: DC | PRN

## 2012-05-18 MED ORDER — ONDANSETRON 4 MG PO TBDP: ORAL_TABLET | ORAL | Status: DC

## 2012-05-18 MED ORDER — ONDANSETRON HCL 4 MG/2ML IJ SOLN
4.0000 mg | Freq: Once | INTRAMUSCULAR | Status: AC
  Administered 2012-05-18: 4 mg via INTRAVENOUS
  Filled 2012-05-18: qty 2

## 2012-05-18 MED ORDER — SODIUM CHLORIDE 0.9 % IV SOLN
Freq: Once | INTRAVENOUS | Status: AC
  Administered 2012-05-18: 21:00:00 via INTRAVENOUS

## 2012-05-18 MED ORDER — MECLIZINE HCL 25 MG PO TABS
25.0000 mg | ORAL_TABLET | Freq: Once | ORAL | Status: AC
  Administered 2012-05-18: 25 mg via ORAL
  Filled 2012-05-18: qty 1

## 2012-05-18 NOTE — ED Notes (Signed)
Call to husband cellphone message left that wife is being discharged.

## 2012-05-18 NOTE — ED Notes (Signed)
Pt c/o sudden onset of dizziness and vomiting to follow x 2 days

## 2012-05-18 NOTE — ED Notes (Signed)
Husband contacted and notified of discharge stated he is ten minutes away and will arrive shortly

## 2012-05-18 NOTE — ED Provider Notes (Signed)
History     CSN: [id number]  Arrival date & time 05/18/12  [place]   First MD Initiated Contact with Patient 05/18/12 2000      Chief Complaint  Patient presents with  . Dizziness  . Emesis    (Consider location/radiation/quality/duration/timing/severity/associated sxs/prior treatment) Patient is a 42 y.o. female presenting with neurologic complaint. The history is provided by the patient.  Neurologic Problem The primary symptoms include dizziness, nausea and vomiting. Primary symptoms do not include syncope, loss of consciousness, seizures, visual change, focal weakness, loss of sensation, speech change or fever. The symptoms began 6 to 12 hours ago. The symptoms are unchanged. The neurological symptoms are diffuse. Context: with position changes.  She describes the dizziness as a sensation of spinning. The dizziness began today. The dizziness has been unchanged since its onset. Dizziness also occurs with nausea and vomiting.    Past Medical History  Diagnosis Date  . Anemia     Past Surgical History  Procedure Date  . Cesarean section     History reviewed. No pertinent family history.  History  Substance Use Topics  . Smoking status: Never Smoker   . Smokeless tobacco: Not on file  . Alcohol Use: No    OB History    Grav Para Term Preterm Abortions TAB SAB Ect Mult Living                  Review of Systems  Constitutional: Negative for fever.  Cardiovascular: Negative for syncope.  Gastrointestinal: Positive for nausea and vomiting.  Neurological: Positive for dizziness. Negative for speech change, focal weakness, seizures and loss of consciousness.  All other systems reviewed and are negative.    Allergies  Review of patient's allergies indicates no known allergies.  Home Medications  No current outpatient prescriptions on file.  BP 107/68  Pulse 81  Temp 98.5 F (36.9 C) (Oral)  Resp 18  Ht 5\' 3"  (1.6 m)  Wt 158 lb (71.668 kg)  BMI 27.99 kg/m2   SpO2 100%  LMP 04/22/2012  Physical Exam  Nursing note and vitals reviewed. Constitutional: She is oriented to person, place, and time. She appears well-developed and well-nourished. No distress.  HENT:  Head: Normocephalic and atraumatic.  Eyes: EOM are normal. Pupils are equal, round, and reactive to light.  Neck: Normal range of motion. Neck supple.  Cardiovascular: Normal rate and regular rhythm.  Exam reveals no friction rub.   No murmur heard. Pulmonary/Chest: Effort normal and breath sounds normal. No respiratory distress. She has no wheezes. She has no rales.  Abdominal: Soft. She exhibits no distension. There is no tenderness. There is no rebound.  Musculoskeletal: Normal range of motion. She exhibits no edema.  Neurological: She is alert and oriented to person, place, and time. No cranial nerve deficit. She exhibits normal muscle tone. Coordination normal.       Unable to do Dix-Hallpike due to severe dizziness. Patient does have fatigable nystagmus.  Skin: Skin is warm. She is not diaphoretic.    ED Course  Procedures (including critical care time)  Labs Reviewed  CBC - Abnormal; Notable for the following:    Hemoglobin 11.1 (*)     HCT 34.6 (*)     MCV 77.8 (*)     MCH 24.9 (*)     RDW 18.3 (*)     All other components within normal limits  URINALYSIS, ROUTINE W REFLEX MICROSCOPIC  PREGNANCY, URINE   No results found.   1. Vertigo  MDM   42-year-old female with history of anemia who presents with sudden onset of dizziness with vomiting for the past 2 days. Patient states dizziness as a sensation of spinning. This is worse with position changes and trying to move. No history of vertigo previously. Denies any motor or sensory loss. Denies any tinnitus, hearing loss. Vitals are stable. Patient has mild fatigable nystagmus on cranial nerve exam. No other cranial nerve abnormalities. No motor or sensory loss. Coordination is normal. Unable to do Dix-Hallpike  due to severe dizziness. Concern for vertigo with her clinical symptoms. Will give meclizine and Zofran.  Symptoms resolved after meclizine. Patient stable for discharge. Given prescription for meclizine and Zofran.       [name], MD 05/19/12 [phone]

## 2012-05-22 NOTE — ED Provider Notes (Signed)
I saw and evaluated the patient, reviewed the resident's note and I agree with the findings and plan.  Kathy Chick, MD 05/22/12 [phone]

## 2012-06-16 ENCOUNTER — Ambulatory Visit: Payer: Self-pay | Admitting: Internal Medicine

## 2012-06-16 VITALS — BP 98/68 | HR 73 | Temp 98.2°F | Resp 16 | Ht 64.0 in | Wt 160.0 lb

## 2012-06-16 DIAGNOSIS — R42 Dizziness and giddiness: Secondary | ICD-10-CM

## 2012-06-16 DIAGNOSIS — H8110 Benign paroxysmal vertigo, unspecified ear: Secondary | ICD-10-CM

## 2012-06-16 LAB — POCT URINALYSIS DIPSTICK
Bilirubin, UA: NEGATIVE
Blood, UA: NEGATIVE
Glucose, UA: NEGATIVE
Ketones, UA: NEGATIVE
Leukocytes, UA: NEGATIVE
Nitrite, UA: NEGATIVE
Protein, UA: NEGATIVE
Spec Grav, UA: 1.02
Urobilinogen, UA: 0.2
pH, UA: 6.5

## 2012-06-16 LAB — POCT CBC
Granulocyte percent: 50.8 %G (ref 37–80)
HCT, POC: 35.2 % — AB (ref 37.7–47.9)
Hemoglobin: 10.8 g/dL — AB (ref 12.2–16.2)
Lymph, poc: 2.2 (ref 0.6–3.4)
MCH, POC: 24.5 pg — AB (ref 27–31.2)
MCHC: 30.7 g/dL — AB (ref 31.8–35.4)
MCV: 79.8 fL — AB (ref 80–97)
MID (cbc): 0.3 (ref 0–0.9)
MPV: 9.1 fL (ref 0–99.8)
POC Granulocyte: 2.6 (ref 2–6.9)
POC LYMPH PERCENT: 42.8 %L (ref 10–50)
POC MID %: 6.4 %M (ref 0–12)
Platelet Count, POC: 438 10*3/uL — AB (ref 142–424)
RBC: 4.41 M/uL (ref 4.04–5.48)
RDW, POC: 20.7 %
WBC: 5.2 10*3/uL (ref 4.6–10.2)

## 2012-06-16 LAB — POCT UA - MICROSCOPIC ONLY
Bacteria, U Microscopic: NEGATIVE
Casts, Ur, LPF, POC: NEGATIVE
Crystals, Ur, HPF, POC: NEGATIVE
Epithelial cells, urine per micros: NEGATIVE
Mucus, UA: NEGATIVE
RBC, urine, microscopic: NEGATIVE
WBC, Ur, HPF, POC: NEGATIVE
Yeast, UA: NEGATIVE

## 2012-06-16 LAB — POCT URINE PREGNANCY: Preg Test, Ur: NEGATIVE

## 2012-06-16 LAB — POCT SEDIMENTATION RATE: POCT SED RATE: 55 mm/hr — AB (ref 0–22)

## 2012-06-16 MED ORDER — LORAZEPAM 1 MG PO TABS: ORAL_TABLET | ORAL | Status: DC

## 2012-06-16 NOTE — Patient Instructions (Addendum)
Benign Positional Vertigo  Vertigo means you feel like you or your surroundings are moving when they are not. Benign positional vertigo is the most common form of vertigo. Benign means that the cause of your condition is not serious. Benign positional vertigo is more common in older adults.  CAUSES   Benign positional vertigo is the result of an upset in the labyrinth system. This is an area in the middle ear that helps control your balance. This may be caused by a viral infection, head injury, or repetitive motion. However, often no specific cause is found.  SYMPTOMS   Symptoms of benign positional vertigo occur when you move your head or eyes in different directions. Some of the symptoms may include:  · Loss of balance and falls.  · Vomiting.  · Blurred vision.  · Dizziness.  · Nausea.  · Involuntary eye movements (nystagmus).  DIAGNOSIS   Benign positional vertigo is usually diagnosed by physical exam. If the specific cause of your benign positional vertigo is unknown, your caregiver may perform imaging tests, such as magnetic resonance imaging (MRI) or computed tomography (CT).  TREATMENT   Your caregiver may recommend movements or procedures to correct the benign positional vertigo. Medicines such as meclizine, benzodiazepines, and medicines for nausea may be used to treat your symptoms. In rare cases, if your symptoms are caused by certain conditions that affect the inner ear, you may need surgery.  HOME CARE INSTRUCTIONS   · Follow your caregiver's instructions.  · Move slowly. Do not make sudden body or head movements.  · Avoid driving.  · Avoid operating heavy machinery.  · Avoid performing any tasks that would be dangerous to you or others during a vertigo episode.  · Drink enough fluids to keep your urine clear or pale yellow.  SEEK IMMEDIATE MEDICAL CARE IF:   · You develop problems with walking, weakness, numbness, or using your arms, hands, or legs.  · You have difficulty speaking.  · You develop  severe headaches.  · Your nausea or vomiting continues or gets worse.  · You develop visual changes.  · Your family or friends notice any behavioral changes.  · Your condition gets worse.  · You have a fever.  · You develop a stiff neck or sensitivity to light.  MAKE SURE YOU:   · Understand these instructions.  · Will watch your condition.  · Will get help right away if you are not doing well or get worse.  Document Released: 01/14/2006 Document Revised: 07/01/2011 Document Reviewed: 12/27/2010  Exit[employer]® Patient Information ©2013 [employer].

## 2012-06-16 NOTE — Progress Notes (Signed)
Subjective:    Patient ID: Kathy Reyes, female    DOB: [birth year], 42 y.o.   MRN: [id number]  HPI Has 3 young children.Marland Kitchen Has had dizzy and nausea for about one month. [facility] gave her meclizine and zofran. She is no better. LMP 1 week ago and normal flow. Not pregnant    Language barrier mild   Review of Systems     Objective:   Physical Exam  Constitutional: She is oriented to person, place, and time. She appears well-developed and well-nourished. No distress.  HENT:  Right Ear: External ear normal.  Left Ear: External ear normal.  Nose: Nose normal.  Mouth/Throat: Oropharynx is clear and moist.  Eyes: EOM are normal. No scleral icterus.  Cardiovascular: Normal rate, regular rhythm and normal heart sounds.   Pulmonary/Chest: Effort normal and breath sounds normal.  Abdominal: Soft.  Musculoskeletal: Normal range of motion.  Neurological: She is alert and oriented to person, place, and time. She displays no tremor. No cranial nerve deficit or sensory deficit. She exhibits normal muscle tone. She displays a negative Romberg sign. Coordination and gait normal.  Skin: Skin is warm and dry.   BP supine 102/52  Standing 98/68 Lungs clear  Heart rr no murmur Results for orders placed in visit on 06/16/12  POCT CBC      Result Value Range   WBC 5.2  4.6 - 10.2 K/uL   Lymph, poc 2.2  0.6 - 3.4   POC LYMPH PERCENT 42.8  10 - 50 %L   MID (cbc) 0.3  0 - 0.9   POC MID % 6.4  0 - 12 %M   POC Granulocyte 2.6  2 - 6.9   Granulocyte percent 50.8  37 - 80 %G   RBC 4.41  4.04 - 5.48 M/uL   Hemoglobin 10.8 (*) 12.2 - 16.2 g/dL   HCT, POC 40.9 (*) 81.1 - 47.9 %   MCV 79.8 (*) 80 - 97 fL   MCH, POC 24.5 (*) 27 - 31.2 pg   MCHC 30.7 (*) 31.8 - 35.4 g/dL   RDW, POC 91.4     Platelet Count, POC 438 (*) 142 - 424 K/uL   MPV 9.1  0 - 99.8 fL  POCT URINE PREGNANCY      Result Value Range   Preg Test, Ur Negative    POCT URINALYSIS DIPSTICK      Result Value Range   Color, UA  YELLOW     Clarity, UA CLEAR     Glucose, UA NEG     Bilirubin, UA NEG     Ketones, UA NEG     Spec Grav, UA 1.020     Blood, UA NEG     pH, UA 6.5     Protein, UA NEG     Urobilinogen, UA 0.2     Nitrite, UA NEG     Leukocytes, UA Negative    POCT UA - MICROSCOPIC ONLY      Result Value Range   WBC, Ur, HPF, POC NEG     RBC, urine, microscopic NEG     Bacteria, U Microscopic NEG     Mucus, UA NEG     Epithelial cells, urine per micros NEG     Crystals, Ur, HPF, POC NEG     Casts, Ur, LPF, POC NEG     Yeast, UA NEG     Head tilt positive      Assessment & Plan:  Lorazepam 1mg   tid prn vertigo If not well 1 week needs brain imaging or referral

## 2015-01-21 ENCOUNTER — Encounter (HOSPITAL_BASED_OUTPATIENT_CLINIC_OR_DEPARTMENT_OTHER): Payer: Self-pay | Admitting: Emergency Medicine

## 2015-01-21 ENCOUNTER — Emergency Department (HOSPITAL_BASED_OUTPATIENT_CLINIC_OR_DEPARTMENT_OTHER)
Admission: EM | Admit: 2015-01-21 | Discharge: 2015-01-21 | Disposition: A | Discharge status: Home / Self Care | Payer: Medicaid Other | Attending: Emergency Medicine | Admitting: Emergency Medicine

## 2015-01-21 DIAGNOSIS — R300 Dysuria: Secondary | ICD-10-CM | POA: Diagnosis present

## 2015-01-21 DIAGNOSIS — Z862 Personal history of diseases of the blood and blood-forming organs and certain disorders involving the immune mechanism: Secondary | ICD-10-CM | POA: Diagnosis not present

## 2015-01-21 DIAGNOSIS — N3000 Acute cystitis without hematuria: Secondary | ICD-10-CM | POA: Diagnosis not present

## 2015-01-21 LAB — URINE MICROSCOPIC-ADD ON

## 2015-01-21 LAB — URINALYSIS, ROUTINE W REFLEX MICROSCOPIC
Bilirubin Urine: NEGATIVE
Glucose, UA: NEGATIVE mg/dL
Hgb urine dipstick: NEGATIVE
Ketones, ur: NEGATIVE mg/dL
Nitrite: POSITIVE — AB
Protein, ur: NEGATIVE mg/dL
Specific Gravity, Urine: 1.002 — ABNORMAL LOW (ref 1.005–1.030)
Urobilinogen, UA: 1 mg/dL (ref 0.0–1.0)
pH: 6 (ref 5.0–8.0)

## 2015-01-21 MED ORDER — HYDROCODONE-ACETAMINOPHEN 5-325 MG PO TABS: 1.0000 | ORAL_TABLET | Freq: Four times a day (QID) | ORAL | Status: DC | PRN

## 2015-01-21 MED ORDER — SULFAMETHOXAZOLE-TRIMETHOPRIM 800-160 MG PO TABS
1.0000 | ORAL_TABLET | Freq: Once | ORAL | Status: AC
  Administered 2015-01-21: 1 via ORAL
  Filled 2015-01-21: qty 1

## 2015-01-21 MED ORDER — ONDANSETRON 4 MG PO TBDP: 4.0000 mg | ORAL_TABLET | Freq: Once | ORAL | Status: DC

## 2015-01-21 MED ORDER — ONDANSETRON 4 MG PO TBDP
4.0000 mg | ORAL_TABLET | Freq: Once | ORAL | Status: AC
  Administered 2015-01-21: 4 mg via ORAL
  Filled 2015-01-21: qty 1

## 2015-01-21 MED ORDER — SULFAMETHOXAZOLE-TRIMETHOPRIM 800-160 MG PO TABS: 1.0000 | ORAL_TABLET | Freq: Two times a day (BID) | ORAL | Status: DC

## 2015-01-21 NOTE — ED Notes (Signed)
Pt reports that thur she developed painful urination and increased frequency today she developed left flank and back pain

## 2015-01-21 NOTE — ED Provider Notes (Signed)
CSN: [id number]     Arrival date & time 01/21/15  1438 History  By signing my name below, I, Kathy Reyes, attest that this documentation has been prepared under the direction and in the presence of Kathy Mocha, MD. Electronically Signed: Phillis Reyes, ED Scribe. 01/21/2015. 4:20 PM.  Chief Complaint  Patient presents with  . Dysuria   Patient is a 44 y.o. female presenting with dysuria. The history is provided by the patient. No language interpreter was used.  Dysuria Pain quality:  Burning Pain severity:  Moderate Onset quality:  Sudden Duration:  3 days Timing:  Constant Progression:  Worsening Chronicity:  New Recent urinary tract infections: no   Ineffective treatments:  None tried Urinary symptoms: frequent urination   Associated symptoms: flank pain, nausea and vomiting   Associated symptoms: no fever    HPI Comments: Kathy Reyes is a 44 y.o. female who presents to the Emergency Department complaining of gradually worsening, constant dysuria and frequency onset 3 days ago. Pt reports left flank and left lower back pain onset one day ago. Reports associated nausea, vomiting x2 and chills. Denies fever, vaginal bleeding, SOB or chest pain. Reports hx of kidney infection. Denies hx of kidney stones. Denies allergies to medications.  Past Medical History  Diagnosis Date  . Anemia    Past Surgical History  Procedure Laterality Date  . Cesarean section     Family History  Problem Relation Age of Onset  . Diabetes Father    Social History  Substance Use Topics  . Smoking status: Never Smoker   . Smokeless tobacco: None  . Alcohol Use: No   OB History    No data available     Review of Systems  Constitutional: Negative for fever.  Respiratory: Negative for shortness of breath.   Cardiovascular: Negative for chest pain.  Gastrointestinal: Positive for nausea and vomiting.  Genitourinary: Positive for dysuria and flank pain. Negative for vaginal bleeding.   Musculoskeletal: Positive for back pain.  All other systems reviewed and are negative.  Allergies  Review of patient's allergies indicates no known allergies.  Home Medications   Prior to Admission medications   Medication Sig Start Date End Date Taking? Authorizing Provider  LORazepam (ATIVAN) 1 MG tablet Take 1/2 to 1 tab po q 8 hrs prn vertigo 06/16/12   Jonita Albee, MD  meclizine (ANTIVERT) 25 MG tablet Take 1 tablet (25 mg total) by mouth 3 (three) times daily as needed. 05/18/12   Kathy Mocha, MD  ondansetron (ZOFRAN ODT) 4 MG disintegrating tablet  ODT q4 hours prn nausea/vomit 05/18/12   Kathy Mocha, MD   BP 106/65 mmHg  Pulse 94  Temp(Src) 98.7 F (37.1 C) (Oral)  Resp 18  Ht  (1.626 m)  Wt 170 lb (77.111 kg)  BMI 29.17 kg/m2  SpO2 99%  LMP 12/27/2014 Physical Exam  Constitutional: She is oriented to person, place, and time. She appears well-developed and well-nourished. No distress.  HENT:  Head: Normocephalic and atraumatic.  Mouth/Throat: Oropharynx is clear and moist.  Eyes: EOM are normal. Pupils are equal, round, and reactive to light.  Neck: Normal range of motion. Neck supple.  Cardiovascular: Normal rate and regular rhythm.  Exam reveals no friction rub.   No murmur heard. Pulmonary/Chest: Effort normal and breath sounds normal. No respiratory distress. She has no wheezes. She has no rales.  Abdominal: Soft. She exhibits no distension. There is tenderness (mild suprapubic, no CVA tenderness). There is no rebound.  Musculoskeletal: Normal range of motion. She exhibits no edema.  Neurological: She is alert and oriented to person, place, and time.  Skin: She is not diaphoretic.  Nursing note and vitals reviewed.   ED Course  Procedures (including critical care time) DIAGNOSTIC STUDIES: Oxygen Saturation is 99% on RA, normal by my interpretation.    COORDINATION OF CARE: 4:19 PM-Discussed treatment plan which includes labs and anti-biotics with  pt at bedside and pt agreed to plan.   Labs Review Labs Reviewed  URINALYSIS, ROUTINE W REFLEX MICROSCOPIC (NOT AT [facility]) - Abnormal; Notable for the following:    Color, Urine ORANGE (*)    Specific Gravity, Urine 1.002 (*)    Nitrite POSITIVE (*)    Leukocytes, UA TRACE (*)    All other components within normal limits  URINE MICROSCOPIC-ADD ON    Imaging Review No results found.    EKG Interpretation None      MDM   Final diagnoses:  Acute cystitis without hematuria    89F here with dysuria. Began 2 days ago. Mild nausea with 2 episodes of vomiting. Here vitals stable. No CVA tenderness, no fever. No concern for pyelonephritis. No other abdominal pain. No blood in the urine, doubt stone. Given antibiotics and zofran. UA shows UTI.   I personally performed the services described in this documentation, which was scribed in my presence. The recorded information has been reviewed and is accurate.     Kathy Mocha, MD 01/21/15 [phone]

## 2015-01-21 NOTE — Discharge Instructions (Signed)

## 2015-04-14 ENCOUNTER — Encounter (HOSPITAL_BASED_OUTPATIENT_CLINIC_OR_DEPARTMENT_OTHER): Payer: Self-pay

## 2015-04-14 ENCOUNTER — Emergency Department (HOSPITAL_BASED_OUTPATIENT_CLINIC_OR_DEPARTMENT_OTHER)
Admission: EM | Admit: 2015-04-14 | Discharge: 2015-04-14 | Disposition: A | Discharge status: Home / Self Care | Payer: Medicaid Other | Attending: Physician Assistant | Admitting: Physician Assistant

## 2015-04-14 DIAGNOSIS — N3000 Acute cystitis without hematuria: Secondary | ICD-10-CM | POA: Insufficient documentation

## 2015-04-14 DIAGNOSIS — Z3202 Encounter for pregnancy test, result negative: Secondary | ICD-10-CM | POA: Diagnosis not present

## 2015-04-14 DIAGNOSIS — D649 Anemia, unspecified: Secondary | ICD-10-CM | POA: Insufficient documentation

## 2015-04-14 DIAGNOSIS — R300 Dysuria: Secondary | ICD-10-CM | POA: Diagnosis present

## 2015-04-14 LAB — URINE MICROSCOPIC-ADD ON

## 2015-04-14 LAB — PREGNANCY, URINE: Preg Test, Ur: NEGATIVE

## 2015-04-14 LAB — URINALYSIS, ROUTINE W REFLEX MICROSCOPIC
Bilirubin Urine: NEGATIVE
Glucose, UA: NEGATIVE mg/dL
KETONES UR: NEGATIVE mg/dL
NITRITE: NEGATIVE
PH: 7 (ref 5.0–8.0)
Protein, ur: NEGATIVE mg/dL
SPECIFIC GRAVITY, URINE: 1.005 (ref 1.005–1.030)

## 2015-04-14 MED ORDER — CEPHALEXIN 500 MG PO CAPS: 500.0000 mg | ORAL_CAPSULE | Freq: Two times a day (BID) | ORAL | Status: DC

## 2015-04-14 MED ORDER — CEPHALEXIN 250 MG PO CAPS
500.0000 mg | ORAL_CAPSULE | Freq: Once | ORAL | Status: AC
  Administered 2015-04-14: 500 mg via ORAL
  Filled 2015-04-14: qty 2

## 2015-04-14 NOTE — ED Notes (Signed)
Pt unable to void-given CCUS kit for ED WR

## 2015-04-14 NOTE — ED Provider Notes (Signed)
CSN: [id number]     Arrival date & time 04/14/15  1239 History   First MD Initiated Contact with Patient 04/14/15 1353     Chief Complaint  Patient presents with  . Dysuria     (Consider location/radiation/quality/duration/timing/severity/associated sxs/prior Treatment) HPI  Patient is a 44 year old female presenting with burning light with urination. Patient has burning with urination, pain with urination. No fevers. Patient does have chills occasionally. Patient denies any sexual activity. She states that she is only sexually active with her husband, who has been out of town for the last month. Patient denies any discharge.     Past Medical History  Diagnosis Date  . Anemia    Past Surgical History  Procedure Laterality Date  . Cesarean section     Family History  Problem Relation Age of Onset  . Diabetes Father    Social History  Substance Use Topics  . Smoking status: Never Smoker   . Smokeless tobacco: None  . Alcohol Use: No   OB History    No data available     Review of Systems  Constitutional: Negative for fever, activity change and fatigue.  Eyes: Negative for discharge.  Respiratory: Negative for shortness of breath.   Cardiovascular: Negative for chest pain.  Gastrointestinal: Negative for abdominal pain.  Genitourinary: Positive for dysuria, urgency, frequency and difficulty urinating. Negative for hematuria, menstrual problem and pelvic pain.  Musculoskeletal: Negative for joint swelling.  Skin: Negative for rash.  Allergic/Immunologic: Negative for immunocompromised state.  Psychiatric/Behavioral: Negative for agitation.      Allergies  Review of patient's allergies indicates no known allergies.  Home Medications   Prior to Admission medications   Medication Sig Start Date End Date Taking? Authorizing Provider  IRON PO Take by mouth.   Yes Historical Provider, MD  cephALEXin (KEFLEX) 500 MG capsule Take 1 capsule (500 mg total) by mouth 2  (two) times daily. 04/14/15   Acey Woodfield Lyn Christion Leonhard, MD   BP 123/79 mmHg  Pulse 98  Temp(Src) 98.5 F (36.9 C) (Oral)  Resp 18  Ht 5\' 4"  (1.626 m)  Wt 170 lb (77.111 kg)  BMI 29.17 kg/m2  SpO2 100%  LMP 03/29/2015 Physical Exam  Constitutional: She is oriented to person, place, and time. She appears well-developed and well-nourished.  HENT:  Head: Normocephalic and atraumatic.  Eyes: Right eye exhibits no discharge. Left eye exhibits no discharge.  Cardiovascular: Normal rate.   Pulmonary/Chest: Effort normal. No respiratory distress. She has no wheezes.  Abdominal: Soft. She exhibits no distension. There is no tenderness. There is no guarding.  Musculoskeletal: She exhibits no edema.  Neurological: She is oriented to person, place, and time.  Skin: Skin is warm and dry. She is not diaphoretic.  Psychiatric: She has a normal mood and affect.  Nursing note and vitals reviewed.   ED Course  Procedures (including critical care time) Labs Review Labs Reviewed  URINALYSIS, ROUTINE W REFLEX MICROSCOPIC (NOT AT [facility]) - Abnormal; Notable for the following:    APPearance CLOUDY (*)    Hgb urine dipstick LARGE (*)    Leukocytes, UA LARGE (*)    All other components within normal limits  URINE MICROSCOPIC-ADD ON - Abnormal; Notable for the following:    Squamous Epithelial / LPF 0-5 (*)    Bacteria, UA MANY (*)    All other components within normal limits  PREGNANCY, URINE    Imaging Review No results found. I have personally reviewed and evaluated these images and lab  results as part of my medical decision-making.   EKG Interpretation None      MDM   Final diagnoses:  Acute cystitis without hematuria    Patient is well appearing 44 year old female presenting with dysuria, frequency. She denies any fever vomiting nausea. She otherwise in her normal state health. Denies any discharge or pelvic pain. Taking PO normally.  Patietn has UTI on labs. Will treat.    Avaiah Stempel Randall An, MD 04/14/15 1423

## 2015-04-14 NOTE — Discharge Instructions (Signed)
Your symtpoms should resolve in the next couple of days. Finish the whole antibiotic course and return if you have no resolution or other concerns.    Urinary Tract Infection Urinary tract infections (UTIs) can develop anywhere along your urinary tract. Your urinary tract is your body's drainage system for removing wastes and extra water. Your urinary tract includes two kidneys, two ureters, a bladder, and a urethra. Your kidneys are a pair of bean-shaped organs. Each kidney is about the size of your fist. They are located below your ribs, one on each side of your spine. CAUSES Infections are caused by microbes, which are microscopic organisms, including fungi, viruses, and bacteria. These organisms are so small that they can only be seen through a microscope. Bacteria are the microbes that most commonly cause UTIs. SYMPTOMS  Symptoms of UTIs may vary by age and gender of the patient and by the location of the infection. Symptoms in young women typically include a frequent and intense urge to urinate and a painful, burning feeling in the bladder or urethra during urination. Older women and men are more likely to be tired, shaky, and weak and have muscle aches and abdominal pain. A fever may mean the infection is in your kidneys. Other symptoms of a kidney infection include pain in your back or sides below the ribs, nausea, and vomiting. DIAGNOSIS To diagnose a UTI, your caregiver will ask you about your symptoms. Your caregiver will also ask you to provide a urine sample. The urine sample will be tested for bacteria and white blood cells. White blood cells are made by your body to help fight infection. TREATMENT  Typically, UTIs can be treated with medication. Because most UTIs are caused by a bacterial infection, they usually can be treated with the use of antibiotics. The choice of antibiotic and length of treatment depend on your symptoms and the type of bacteria causing your infection. HOME CARE  INSTRUCTIONS  If you were prescribed antibiotics, take them exactly as your caregiver instructs you. Finish the medication even if you feel better after you have only taken some of the medication.  Drink enough water and fluids to keep your urine clear or pale yellow.  Avoid caffeine, tea, and carbonated beverages. They tend to irritate your bladder.  Empty your bladder often. Avoid holding urine for long periods of time.  Empty your bladder before and after sexual intercourse.  After a bowel movement, women should cleanse from front to back. Use each tissue only once. SEEK MEDICAL CARE IF:   You have back pain.  You develop a fever.  Your symptoms do not begin to resolve within 3 days. SEEK IMMEDIATE MEDICAL CARE IF:   You have severe back pain or lower abdominal pain.  You develop chills.  You have nausea or vomiting.  You have continued burning or discomfort with urination. MAKE SURE YOU:   Understand these instructions.  Will watch your condition.  Will get help right away if you are not doing well or get worse.   This information is not intended to replace advice given to you by your health care provider. Make sure you discuss any questions you have with your health care provider.   Document Released: 01/16/2005 Document Revised: 12/28/2014 Document Reviewed: 05/17/2011 Elsevier Interactive Patient Education [employer]

## 2015-04-14 NOTE — ED Notes (Signed)
Dysuria x 3 days-NAD

## 2016-06-08 ENCOUNTER — Emergency Department (HOSPITAL_BASED_OUTPATIENT_CLINIC_OR_DEPARTMENT_OTHER): Payer: Medicaid Other

## 2016-06-08 ENCOUNTER — Emergency Department (HOSPITAL_BASED_OUTPATIENT_CLINIC_OR_DEPARTMENT_OTHER)
Admission: EM | Admit: 2016-06-08 | Discharge: 2016-06-08 | Disposition: A | Discharge status: Home / Self Care | Payer: Medicaid Other | Attending: Emergency Medicine | Admitting: Emergency Medicine

## 2016-06-08 ENCOUNTER — Encounter (HOSPITAL_BASED_OUTPATIENT_CLINIC_OR_DEPARTMENT_OTHER): Payer: Self-pay | Admitting: Emergency Medicine

## 2016-06-08 DIAGNOSIS — Z79899 Other long term (current) drug therapy: Secondary | ICD-10-CM | POA: Insufficient documentation

## 2016-06-08 DIAGNOSIS — R05 Cough: Secondary | ICD-10-CM | POA: Diagnosis present

## 2016-06-08 DIAGNOSIS — J069 Acute upper respiratory infection, unspecified: Secondary | ICD-10-CM | POA: Diagnosis not present

## 2016-06-08 MED ORDER — BENZONATATE 100 MG PO CAPS: 200.0000 mg | ORAL_CAPSULE | Freq: Two times a day (BID) | ORAL | 0 refills | Status: DC | PRN

## 2016-06-08 MED ORDER — AZITHROMYCIN 250 MG PO TABS: 250.0000 mg | ORAL_TABLET | Freq: Every day | ORAL | 0 refills | Status: DC

## 2016-06-08 NOTE — ED Provider Notes (Signed)
[facility]   CSN: [id number] Arrival date & time: 06/08/16  1239     History   Chief Complaint Chief Complaint  Patient presents with  . Cough    HPI Kathy Reyes is a 46 y.o. female.  Patient presents to the emergency department with chief complaint of cough 1 week. She reports associated intermittent fevers and chills. She reports associated sore throat. She has tried taking over-the-counter cough and cold medicine with some relief. She reports that she did get a flu shot this year. She denies any abdominal pain, nausea, or vomiting. There are no other associated symptoms. There are no other modifying factors. He denies history of asthma or COPD.   The history is provided by the patient. No language interpreter was used.    Past Medical History:  Diagnosis Date  . Anemia     There are no active problems to display for this patient.   Past Surgical History:  Procedure Laterality Date  . CESAREAN SECTION      OB History    No data available       Home Medications    Prior to Admission medications   Medication Sig Start Date End Date Taking? Authorizing Provider  IRON PO Take by mouth.   Yes Historical Provider, MD  cephALEXin (KEFLEX) 500 MG capsule Take 1 capsule (500 mg total) by mouth 2 (two) times daily. 04/14/15   Courteney Lyn Mackuen, MD    Family History Family History  Problem Relation Age of Onset  . Diabetes Father     Social History Social History  Substance Use Topics  . Smoking status: Never Smoker  . Smokeless tobacco: Never Used  . Alcohol use No     Allergies   Patient has no known allergies.   Review of Systems Review of Systems  All other systems reviewed and are negative.    Physical Exam Updated Vital Signs BP 125/65 (BP Location: Right Arm)   Pulse 97   Temp 98.2 F (36.8 C) (Oral)   Resp 24   Ht 5\' 4"  (1.626 m)   Wt 72.6 kg   LMP 06/06/2016   SpO2 100%   BMI 27.46 kg/m    Physical Exam  Constitutional: She is oriented to person, place, and time. She appears well-developed and well-nourished.  HENT:  Head: Normocephalic and atraumatic.  Eyes: Conjunctivae and EOM are normal. Pupils are equal, round, and reactive to light.  Neck: Normal range of motion. Neck supple.  Cardiovascular: Normal rate and regular rhythm.  Exam reveals no gallop and no friction rub.   No murmur heard. Pulmonary/Chest: Effort normal and breath sounds normal. No respiratory distress. She has no wheezes. She has no rales. She exhibits no tenderness.  Abdominal: Soft. Bowel sounds are normal. She exhibits no distension and no mass. There is no tenderness. There is no rebound and no guarding.  Musculoskeletal: Normal range of motion. She exhibits no edema or tenderness.  Neurological: She is alert and oriented to person, place, and time.  Skin: Skin is warm and dry.  Psychiatric: She has a normal mood and affect. Her behavior is normal. Judgment and thought content normal.  Nursing note and vitals reviewed.    ED Treatments / Results  Labs (all labs ordered are listed, but only abnormal results are displayed) Labs Reviewed - No data to display  EKG  EKG Interpretation None       Radiology Dg Chest 2 View  Result Date:  06/08/2016 CLINICAL DATA:  Cough, congestion, fever and chills for several days EXAM: CHEST  2 VIEW COMPARISON:  08/27/2004 FINDINGS: Normal heart size, mediastinal contours, and pulmonary vascularity. Lungs clear. No pleural effusion or pneumothorax. Bones unremarkable. IMPRESSION: Normal exam. Electronically Signed   By: Ulyses SouthwardMark  Boles M.D.   On: 06/08/2016 13:28    Procedures Procedures (including critical care time)  Medications Ordered in ED Medications - No data to display   Initial Impression / Assessment and Plan / ED Course  I have reviewed the triage vital signs and the nursing notes.  Pertinent labs & imaging results that were available during  my care of the patient were reviewed by me and considered in my medical decision making (see chart for details).     Patient with URI symptoms.  Flu thought to be less likely, but considered.  Patient has had symptoms for over a week.  She may benefit from a short course of antibiotic.  Will also give tessalon.  Recommend outpatient follow-up in a week if she is not better.  Final Clinical Impressions(s) / ED Diagnoses   Final diagnoses:  Upper respiratory tract infection, unspecified type    New Prescriptions New Prescriptions   AZITHROMYCIN (ZITHROMAX) 250 MG TABLET    Take 1 tablet (250 mg total) by mouth daily. Take first 2 tablets together, then 1 every day until finished.   BENZONATATE (TESSALON) 100 MG CAPSULE    Take 2 capsules (200 mg total) by mouth 2 (two) times daily as needed for cough.     Roxy Horsemanobert Adabella Stanis, PA-C 06/08/16 1435    Vanetta MuldersScott Zackowski, MD 06/09/16 [phone]

## 2016-06-08 NOTE — ED Triage Notes (Signed)
Productive cough and congestion x 1 week. Pain to chest and back when coughing

## 2016-10-30 ENCOUNTER — Emergency Department (HOSPITAL_BASED_OUTPATIENT_CLINIC_OR_DEPARTMENT_OTHER)
Admission: EM | Admit: 2016-10-30 | Discharge: 2016-10-30 | Disposition: A | Discharge status: Home / Self Care | Payer: Medicaid Other | Attending: Emergency Medicine | Admitting: Emergency Medicine

## 2016-10-30 ENCOUNTER — Encounter (HOSPITAL_BASED_OUTPATIENT_CLINIC_OR_DEPARTMENT_OTHER): Payer: Self-pay

## 2016-10-30 DIAGNOSIS — R300 Dysuria: Secondary | ICD-10-CM | POA: Diagnosis not present

## 2016-10-30 DIAGNOSIS — G44219 Episodic tension-type headache, not intractable: Secondary | ICD-10-CM | POA: Diagnosis not present

## 2016-10-30 DIAGNOSIS — R51 Headache: Secondary | ICD-10-CM | POA: Diagnosis present

## 2016-10-30 LAB — URINALYSIS, ROUTINE W REFLEX MICROSCOPIC
Bilirubin Urine: NEGATIVE
GLUCOSE, UA: NEGATIVE mg/dL
Hgb urine dipstick: NEGATIVE
Ketones, ur: NEGATIVE mg/dL
LEUKOCYTES UA: NEGATIVE
Nitrite: NEGATIVE
PH: 6.5 (ref 5.0–8.0)
Protein, ur: NEGATIVE mg/dL
SPECIFIC GRAVITY, URINE: 1.005 (ref 1.005–1.030)

## 2016-10-30 LAB — PREGNANCY, URINE: Preg Test, Ur: NEGATIVE

## 2016-10-30 NOTE — ED Triage Notes (Signed)
C/o HA, dizziness "for a long time-months"-also c/o dysuria x 1 week-NAD-steady gait

## 2016-10-30 NOTE — ED Provider Notes (Signed)
[facility] Provider Note   CSN: [id number] Arrival date & time: 10/30/16  1440     History   Chief Complaint Chief Complaint  Patient presents with  . Headache    HPI Kathy Reyes is a 46 y.o. female.  The history is provided by the patient.  Headache   This is a recurrent problem. Episode onset: 3 days. Episode frequency: In the evening. The problem has been resolved. The headache is associated with nothing. The pain is located in the temporal and frontal region. The quality of the pain is described as dull. The pain does not radiate. Associated symptoms include nausea. Pertinent negatives include no fever, no malaise/fatigue, no palpitations, no shortness of breath and no vomiting. She has tried NSAIDs for the symptoms. The treatment provided significant relief.  Dysuria   This is a new problem. Episode onset: 4 days. The problem occurs every urination. The problem has not changed since onset.The quality of the pain is described as burning. There has been no fever. Associated symptoms include nausea. Pertinent negatives include no chills, no vomiting, no hematuria and no possible pregnancy. Her past medical history is significant for recurrent UTIs. Her past medical history does not include kidney stones.    Past Medical History:  Diagnosis Date  . Anemia     There are no active problems to display for this patient.   Past Surgical History:  Procedure Laterality Date  . CESAREAN SECTION      OB History    No data available       Home Medications    Prior to Admission medications   Not on File    Family History Family History  Problem Relation Age of Onset  . Diabetes Father     Social History Social History  Substance Use Topics  . Smoking status: Never Smoker  . Smokeless tobacco: Never Used  . Alcohol use No     Allergies   Patient has no known allergies.   Review of Systems Review of Systems  Constitutional: Negative for  chills, fever and malaise/fatigue.  Respiratory: Negative for shortness of breath.   Cardiovascular: Negative for palpitations.  Gastrointestinal: Positive for nausea. Negative for vomiting.  Genitourinary: Positive for dysuria. Negative for hematuria.  Neurological: Positive for headaches.  All other systems are reviewed and are negative for acute change except as noted in the HPI    Physical Exam Updated Vital Signs BP 100/64 (BP Location: Right Arm)   Pulse 68   Temp 98.9 F (37.2 C) (Oral)   Resp 18   Ht 5\' 4"  (1.626 m)   Wt 73 kg (160 lb 15 oz)   LMP 10/19/2016   SpO2 100%   BMI 27.62 kg/m   Physical Exam  Constitutional: She is oriented to person, place, and time. She appears well-developed and well-nourished. No distress.  HENT:  Head: Normocephalic and atraumatic.  Nose: Nose normal.  Eyes: Conjunctivae and EOM are normal. Pupils are equal, round, and reactive to light. Right eye exhibits no discharge. Left eye exhibits no discharge. No scleral icterus.  Neck: Normal range of motion. Neck supple.  Cardiovascular: Normal rate and regular rhythm.  Exam reveals no gallop and no friction rub.   No murmur heard. Pulmonary/Chest: Effort normal and breath sounds normal. No stridor. No respiratory distress. She has no rales.  Abdominal: Soft. She exhibits no distension. There is no tenderness.  Musculoskeletal: She exhibits no edema or tenderness.  Neurological: She is alert and  oriented to person, place, and time.  Mental Status: Alert and oriented to person, place, and time. Attention and concentration normal. Speech clear. Recent memory is intact  Cranial Nerves  II Visual Fields: Intact to confrontation. Visual fields intact. III, IV, VI: Pupils equal and reactive to light and near. Full eye movement without nystagmus  V Facial Sensation: Normal. No weakness of masticatory muscles  VII: No facial weakness or asymmetry  VIII Auditory Acuity: Grossly normal  IX/X: The  uvula is midline; the palate elevates symmetrically  XI: Normal sternocleidomastoid and trapezius strength  XII: The tongue is midline. No atrophy or fasciculations.   Motor System: Muscle Strength: 5/5 and symmetric in the upper and lower extremities. No pronation or drift.  Muscle Tone: Tone and muscle bulk are normal in the upper and lower extremities.   Reflexes: DTRs: 1+ and symmetrical in all four extremities. Plantar responses are flexor bilaterally.  Coordination: Intact finger-to-nose. No tremor.  Sensation: Intact to light touch, and pinprick. Gait: Routine gait normal.    Skin: Skin is warm and dry. No rash noted. She is not diaphoretic. No erythema.  Psychiatric: She has a normal mood and affect.  Vitals reviewed.    ED Treatments / Results  Labs (all labs ordered are listed, but only abnormal results are displayed) Labs Reviewed  URINALYSIS, ROUTINE W REFLEX MICROSCOPIC  PREGNANCY, URINE    EKG  EKG Interpretation None       Radiology No results found.  Procedures Procedures (including critical care time)  Medications Ordered in ED Medications - No data to display   Initial Impression / Assessment and Plan / ED Course  I have reviewed the triage vital signs and the nursing notes.  Pertinent labs & imaging results that were available during my care of the patient were reviewed by me and considered in my medical decision making (see chart for details).     1. Non focal neuro exam. No recent head trauma. No fever. Doubt meningitis. Doubt intracranial bleed. Doubt IIH. No indication for imaging. Currently headache free and no need for treatment at this time.  2. Dysuria UA without evidence of infection. UPT negative.   No evidence of acute/emergent process. The patient is safe for discharge with strict return precautions.  Final Clinical Impressions(s) / ED Diagnoses   Final diagnoses:  Episodic tension-type headache, not intractable  Dysuria    Disposition: Discharge  Condition: Good  I have discussed the results, Dx and Tx plan with the patient who expressed understanding and agree(s) with the plan. Discharge instructions discussed at great length. The patient was given strict return precautions who verbalized understanding of the instructions. No further questions at time of discharge.    New Prescriptions   No medications on file    Follow Up: Primary care provider  Schedule an appointment as soon as possible for a visit  in 5-7 days, If symptoms do not improve or  worsen      Sahej Schrieber, Amadeo GarnetPedro Eduardo, MD 10/30/16 1723

## 2017-05-18 DIAGNOSIS — B20 Human immunodeficiency virus [HIV] disease: Secondary | ICD-10-CM | POA: Insufficient documentation

## 2019-04-17 ENCOUNTER — Encounter (HOSPITAL_BASED_OUTPATIENT_CLINIC_OR_DEPARTMENT_OTHER): Payer: Self-pay | Admitting: Emergency Medicine

## 2019-04-17 ENCOUNTER — Other Ambulatory Visit: Payer: Self-pay

## 2019-04-17 ENCOUNTER — Emergency Department (HOSPITAL_BASED_OUTPATIENT_CLINIC_OR_DEPARTMENT_OTHER)
Admission: EM | Admit: 2019-04-17 | Discharge: 2019-04-17 | Disposition: A | Discharge status: Home / Self Care | Payer: Self-pay | Attending: Emergency Medicine | Admitting: Emergency Medicine

## 2019-04-17 DIAGNOSIS — Z79899 Other long term (current) drug therapy: Secondary | ICD-10-CM | POA: Insufficient documentation

## 2019-04-17 DIAGNOSIS — K047 Periapical abscess without sinus: Secondary | ICD-10-CM | POA: Insufficient documentation

## 2019-04-17 DIAGNOSIS — K0889 Other specified disorders of teeth and supporting structures: Secondary | ICD-10-CM

## 2019-04-17 MED ORDER — BUPIVACAINE HCL (PF) 0.5 % IJ SOLN
0.8000 mL | Freq: Once | INTRAMUSCULAR | Status: AC
  Administered 2019-04-17: 15:00:00 0.8 mL
  Filled 2019-04-17: qty 10

## 2019-04-17 MED ORDER — AMOXICILLIN-POT CLAVULANATE 875-125 MG PO TABS: 1.0000 | ORAL_TABLET | Freq: Two times a day (BID) | ORAL | 0 refills | Status: DC

## 2019-04-17 NOTE — ED Provider Notes (Signed)
[facility] EMERGENCY DEPARTMENT Provider Note   CSN: [id number] Arrival date & time: 04/17/19  1328     History Chief Complaint  Patient presents with  . Dental Pain    Kathy Reyes is a 48 y.o. female   HPI  Patient is a 48 year old female presented today with 1 week of worsening left upper dental pain.  Patient states that she had initially done 3 weeks ago and the tooth next to where her dental pain is.  Patient denies any bleeding of her gums.  States that she has felt subjective fevers but is not had a fever when measured at home.  She is used Tylenol and ibuprofen at home for pain.  Denies any nausea or vomiting.  States that she is having some difficulty eating due to the pain.   Patient states that she broke her left upper tooth approximately 1 month ago and has been constant pain since but now it is sharp, constant, severe, 10/10, nonradiating.  Patient denies any difficulty swallowing, sore throat, change in voice speech.      Past Medical History:  Diagnosis Date  . Anemia     There are no problems to display for this patient.   Past Surgical History:  Procedure Laterality Date  . CESAREAN SECTION       OB History   No obstetric history on file.     Family History  Problem Relation Age of Onset  . Diabetes Father     Social History   Tobacco Use  . Smoking status: Never Smoker  . Smokeless tobacco: Never Used  Substance Use Topics  . Alcohol use: No  . Drug use: No    Home Medications Prior to Admission medications   Medication Sig Start Date End Date Taking? Authorizing Provider  abacavir-dolutegravir-lamiVUDine (TRIUMEQ) 600-50-300 MG tablet TAKE 1 TABLET BY MOUTH EVERY DAY 01/31/19  Yes [provider]  ferrous sulfate 325 (65 FE) MG EC tablet Take by mouth. 10/02/18  Yes [provider]  amoxicillin-clavulanate (AUGMENTIN) 875-125 MG tablet Take 1 tablet by mouth every 12 (twelve) hours. 04/17/19    Tedd Sias, PA    Allergies    Patient has no known allergies.  Review of Systems   Review of Systems  Constitutional: Positive for fever (Subjective). Negative for chills.  HENT: Positive for dental problem. Negative for congestion.   Eyes: Negative for pain.  Respiratory: Negative for cough and shortness of breath.   Cardiovascular: Negative for chest pain and leg swelling.  Gastrointestinal: Negative for abdominal pain and vomiting.  Genitourinary: Negative for dysuria.  Musculoskeletal: Negative for myalgias.  Skin: Negative for rash.  Neurological: Negative for dizziness and headaches.    Physical Exam Updated Vital Signs BP 127/90   Pulse 96   Temp 98.1 F (36.7 C) (Oral)   Resp 18   Ht 5\' 6"  (1.676 m)   Wt 79.4 kg   LMP 03/19/2019   SpO2 100%   BMI 28.25 kg/m   Physical Exam Vitals and nursing note reviewed.  Constitutional:      General: She is not in acute distress.    Appearance: Normal appearance. She is not ill-appearing.  HENT:     Head: Normocephalic and atraumatic.     Mouth/Throat:     Mouth: Mucous membranes are moist.     Pharynx: No oropharyngeal exudate or posterior oropharyngeal erythema.      Comments: Full range of motion of tongue.  No swelling or  tenderness underneath the tongue.  Able to swallow and phonate without difficulty. Eyes:     General: No scleral icterus.       Right eye: No discharge.        Left eye: No discharge.     Conjunctiva/sclera: Conjunctivae normal.  Cardiovascular:     Rate and Rhythm: Normal rate.     Pulses: Normal pulses.  Pulmonary:     Effort: Pulmonary effort is normal.     Breath sounds: No stridor.  Neurological:     Mental Status: She is alert and oriented to person, place, and time. Mental status is at baseline.      ED Results / Procedures / Treatments   Labs (all labs ordered are listed, but only abnormal results are displayed) Labs Reviewed - No data to  display  EKG None  Radiology No results found.  Procedures  Dental Block  Date/Time: 04/17/2019 3:19 PM Performed by: Gailen ShelterFondaw, Feleica Fulmore S, PA Authorized by: Gailen ShelterFondaw, Eason Housman S, PA   Consent:    Consent obtained:  Verbal   Consent given by:  Patient   Risks discussed:  Allergic reaction, infection, pain and nerve damage   Alternatives discussed:  No treatment and delayed treatment Location:    Block type:  Supraperiosteal   Supraperiosteal location:  Upper teeth   Upper teeth location:  12/LU 1st bicuspid Procedure details (see MAR for exact dosages):    Topical anesthesia: marcaine    Syringe type:  Controlled syringe   Needle gauge:  27 G   Anesthetic injected:  Bupivacaine 0.5% WITH epi   Injection procedure:  Anatomic landmarks identified, introduced needle, negative aspiration for blood and anatomic landmarks palpated Post-procedure details:    Outcome:  Pain relieved   Patient tolerance of procedure:  Tolerated well, no immediate complications  (including critical care time)  Medications Ordered in ED Medications  bupivacaine (MARCAINE) 0.5 % injection 0.8 mL (0.8 mLs Infiltration Given by Other 04/17/19 1528)    ED Course  I have reviewed the triage vital signs and the nursing notes.  Pertinent labs & imaging results that were available during my care of the patient were reviewed by me and considered in my medical decision making (see chart for details).    MDM Rules/Calculators/A&P                      Patient is 48 year old female with dental pain.  Has been present for months but worsening over the last week.  She has a dentist to follow-up with.  States that she has been having trouble eating due to the pain.  Endorses subjective fevers at home.  On physical exam patient has broken left upper molar with extreme tenderness to percussion.  Denies any abscess present there is no evidence of Ludwig's angina on physical exam.  Patient is afebrile vitals are normal  limits here.  She has no difficulty swallowing or managing secretions or breathing.  Suspect the patient has simple dental infection.  Will treat with Augmentin and dental block for pain management.  Will recommend Tylenol ibuprofen for pain.  Patient will follow up with the dentist Monday.   This patient appears reasonably screened and I doubt any other medical condition requiring further workup, evaluation, or treatment in the ED at this time prior to discharge.   Patient's vitals are WNL apart from vital sign abnormalities discussed above, patient is in NAD, and able to ambulate in the ED at their baseline. Pain  has been managed or a plan has been made for home management and has no complaints prior to discharge. Patient is comfortable with above plan and is stable for discharge at this time. All questions were answered prior to disposition. Results from the ER workup discussed with the patient face to face and all questions answered to the best of my ability. The patient is safe for discharge with strict return precautions. Patient appears safe for discharge with appropriate follow-up. Conveyed my impression with the patient and they voiced understanding and are agreeable to plan.   An After Visit Summary was printed and given to the patient.  Portions of this note were generated with Scientist, clinical (histocompatibility and immunogenetics). Dictation errors may occur despite best attempts at proofreading.     Final Clinical Impression(s) / ED Diagnoses Final diagnoses:  Dental infection    Rx / DC Orders ED Discharge Orders         Ordered    amoxicillin-clavulanate (AUGMENTIN) 875-125 MG tablet  Every 12 hours     04/17/19 1516           Gailen Shelter, [place] 04/17/19 1538    Terrilee Files, MD 04/17/19 1730

## 2019-04-17 NOTE — ED Triage Notes (Signed)
Pt c/o dental pain to left upper tooth x 1 week. Pt had filling done 3 weeks ago.

## 2019-04-17 NOTE — Discharge Instructions (Signed)
Please follow-up with your dentist please call Monday for an appointment.  Please take antibiotics as prescribed.  Use Tylenol and ibuprofen for pain.

## 2020-02-12 ENCOUNTER — Emergency Department (HOSPITAL_BASED_OUTPATIENT_CLINIC_OR_DEPARTMENT_OTHER): Payer: Medicaid Other

## 2020-02-12 ENCOUNTER — Other Ambulatory Visit: Payer: Self-pay

## 2020-02-12 ENCOUNTER — Emergency Department (HOSPITAL_BASED_OUTPATIENT_CLINIC_OR_DEPARTMENT_OTHER)
Admission: EM | Admit: 2020-02-12 | Discharge: 2020-02-12 | Disposition: A | Discharge status: Home / Self Care | Payer: Medicaid Other | Attending: Emergency Medicine | Admitting: Emergency Medicine

## 2020-02-12 ENCOUNTER — Encounter (HOSPITAL_BASED_OUTPATIENT_CLINIC_OR_DEPARTMENT_OTHER): Payer: Self-pay | Admitting: *Deleted

## 2020-02-12 DIAGNOSIS — R109 Unspecified abdominal pain: Secondary | ICD-10-CM

## 2020-02-12 DIAGNOSIS — R110 Nausea: Secondary | ICD-10-CM | POA: Insufficient documentation

## 2020-02-12 DIAGNOSIS — R1032 Left lower quadrant pain: Secondary | ICD-10-CM | POA: Insufficient documentation

## 2020-02-12 DIAGNOSIS — R5383 Other fatigue: Secondary | ICD-10-CM | POA: Insufficient documentation

## 2020-02-12 DIAGNOSIS — R6883 Chills (without fever): Secondary | ICD-10-CM | POA: Insufficient documentation

## 2020-02-12 DIAGNOSIS — R300 Dysuria: Secondary | ICD-10-CM | POA: Insufficient documentation

## 2020-02-12 DIAGNOSIS — R350 Frequency of micturition: Secondary | ICD-10-CM | POA: Insufficient documentation

## 2020-02-12 DIAGNOSIS — R531 Weakness: Secondary | ICD-10-CM | POA: Insufficient documentation

## 2020-02-12 HISTORY — DX: Human immunodeficiency virus (HIV) disease: B20

## 2020-02-12 HISTORY — DX: Encounter for other specified aftercare: Z51.89

## 2020-02-12 LAB — CBC WITH DIFFERENTIAL/PLATELET
Abs Immature Granulocytes: 0.04 10*3/uL (ref 0.00–0.07)
Basophils Absolute: 0 10*3/uL (ref 0.0–0.1)
Basophils Relative: 0 %
Eosinophils Absolute: 0.1 10*3/uL (ref 0.0–0.5)
Eosinophils Relative: 2 %
HCT: 35.3 % — ABNORMAL LOW (ref 36.0–46.0)
Hemoglobin: 10.5 g/dL — ABNORMAL LOW (ref 12.0–15.0)
Immature Granulocytes: 1 %
Lymphocytes Relative: 34 %
Lymphs Abs: 1.6 10*3/uL (ref 0.7–4.0)
MCH: 22.7 pg — ABNORMAL LOW (ref 26.0–34.0)
MCHC: 29.7 g/dL — ABNORMAL LOW (ref 30.0–36.0)
MCV: 76.2 fL — ABNORMAL LOW (ref 80.0–100.0)
Monocytes Absolute: 0.3 10*3/uL (ref 0.1–1.0)
Monocytes Relative: 7 %
Neutro Abs: 2.5 10*3/uL (ref 1.7–7.7)
Neutrophils Relative %: 56 %
Platelets: 279 10*3/uL (ref 150–400)
RBC: 4.63 MIL/uL (ref 3.87–5.11)
RDW: 17.8 % — ABNORMAL HIGH (ref 11.5–15.5)
WBC: 4.5 10*3/uL (ref 4.0–10.5)
nRBC: 0 % (ref 0.0–0.2)

## 2020-02-12 LAB — URINALYSIS, ROUTINE W REFLEX MICROSCOPIC
Bilirubin Urine: NEGATIVE
Glucose, UA: NEGATIVE mg/dL
Hgb urine dipstick: NEGATIVE
Ketones, ur: NEGATIVE mg/dL
Leukocytes,Ua: NEGATIVE
Nitrite: NEGATIVE
Protein, ur: NEGATIVE mg/dL
Specific Gravity, Urine: <1.005 — ABNORMAL LOW (ref 1.005–1.030)
pH: 6.5 (ref 5.0–8.0)

## 2020-02-12 LAB — BASIC METABOLIC PANEL
Anion gap: 10 (ref 5–15)
BUN: 4 mg/dL — ABNORMAL LOW (ref 6–20)
CO2: 23 mmol/L (ref 22–32)
Calcium: 9.1 mg/dL (ref 8.9–10.3)
Chloride: 104 mmol/L (ref 98–111)
Creatinine, Ser: 0.57 mg/dL (ref 0.44–1.00)
GFR, Estimated: >60 mL/min (ref >60)
Glucose, Bld: 129 mg/dL — ABNORMAL HIGH (ref 70–99)
Potassium: 4.2 mmol/L (ref 3.5–5.1)
Sodium: 137 mmol/L (ref 135–145)

## 2020-02-12 LAB — PREGNANCY, URINE: Preg Test, Ur: NEGATIVE

## 2020-02-12 MED ORDER — METHOCARBAMOL 500 MG PO TABS: 500.0000 mg | ORAL_TABLET | Freq: Two times a day (BID) | ORAL | 0 refills | Status: DC | PRN

## 2020-02-12 MED ORDER — IBUPROFEN 600 MG PO TABS: 600.0000 mg | ORAL_TABLET | Freq: Four times a day (QID) | ORAL | 0 refills | Status: AC | PRN

## 2020-02-12 NOTE — ED Provider Notes (Signed)
[facility] EMERGENCY DEPARTMENT Provider Note   CSN: [id number] Arrival date & time: 02/12/20  1429     History Chief Complaint  Patient presents with  . Dysuria    Kathy Reyes is a 49 y.o. female with PMH of HIV on [place] who presents the ED with a 3-day history of dysuria, increased urinary frequency, and back pain.  On my examination, patient informed me that she has had progressively worsening left-sided flank pain.  Currently it is 9 out of 10 discomfort.  She states that she has had acute cystitis in the past, however this feels different.  While she endorses increased urinary frequency and dysuria which is consistent with prior UTI, she has never had this kind of back pain which is why she is here in the ED for evaluation.  She is followed by primary care provider in [place], Dr. Hyacinth Meeker.  She denies any fevers, but does endorse chills, fatigue, weakness, and nausea.  She states that she feels sick.  She denies any chest pain, difficulty breathing, cough, obvious hematuria, hematochezia or other changes in bowel habits, vomiting, or other symptoms.  HPI     Past Medical History:  Diagnosis Date  . Anemia   . Anemia   . Blood transfusion without reported diagnosis   . HIV disease (HCC)     There are no problems to display for this patient.   Past Surgical History:  Procedure Laterality Date  . CESAREAN SECTION       OB History   No obstetric history on file.     Family History  Problem Relation Age of Onset  . Diabetes Father     Social History   Tobacco Use  . Smoking status: Never Smoker  . Smokeless tobacco: Never Used  Vaping Use  . Vaping Use: Never used  Substance Use Topics  . Alcohol use: No  . Drug use: No    Home Medications Prior to Admission medications   Medication Sig Start Date End Date Taking? Authorizing Provider  abacavir-dolutegravir-lamiVUDine (TRIUMEQ) 600-50-300 MG tablet TAKE 1 TABLET BY MOUTH EVERY  DAY 01/31/19  Yes [provider]  ferrous sulfate 325 (65 FE) MG EC tablet Take by mouth. 10/02/18  Yes [provider]  amoxicillin-clavulanate (AUGMENTIN) 875-125 MG tablet Take 1 tablet by mouth every 12 (twelve) hours. 04/17/19   Gailen Shelter, PA  ibuprofen (ADVIL) 600 MG tablet Take 1 tablet (600 mg total) by mouth every 6 (six) hours as needed for moderate pain. 02/12/20   Lorelee New, PA-C  methocarbamol (ROBAXIN) 500 MG tablet Take 1 tablet (500 mg total) by mouth 2 (two) times daily as needed for muscle spasms. 02/12/20   Lorelee New, PA-C    Allergies    Patient has no known allergies.  Review of Systems   Review of Systems  All other systems reviewed and are negative.   Physical Exam Updated Vital Signs BP 123/72 (BP Location: Right Arm)   Pulse 75   Temp 98.2 F (36.8 C) (Oral)   Resp 16   Ht 5\' 6"  (1.676 m)   Wt 88.5 kg   SpO2 100%   BMI 31.47 kg/m   Physical Exam Vitals and nursing note reviewed. Exam conducted with a chaperone present.  HENT:     Head: Normocephalic and atraumatic.  Eyes:     General: No scleral icterus.    Conjunctiva/sclera: Conjunctivae normal.  Cardiovascular:     Rate and Rhythm: Normal  rate and regular rhythm.     Pulses: Normal pulses.     Heart sounds: Normal heart sounds.  Pulmonary:     Effort: Pulmonary effort is normal. No respiratory distress.     Breath sounds: Normal breath sounds.  Abdominal:     Comments: Soft, nondistended.  No rigidity.  TTP in suprapubic and LLQ regions.  No significant TTP elsewhere.  Positive left-sided CVAT.  Negative right-sided CVAT.  No overlying skin changes.    Musculoskeletal:     Right lower leg: No edema.     Left lower leg: No edema.  Skin:    General: Skin is dry.     Capillary Refill: Capillary refill takes less than 2 seconds.  Neurological:     Mental Status: She is alert and oriented to person, place, and time.     GCS: GCS eye subscore is 4. GCS  verbal subscore is 5. GCS motor subscore is 6.  Psychiatric:        Mood and Affect: Mood normal.        Behavior: Behavior normal.        Thought Content: Thought content normal.     ED Results / Procedures / Treatments   Labs (all labs ordered are listed, but only abnormal results are displayed) Labs Reviewed  URINALYSIS, ROUTINE W REFLEX MICROSCOPIC - Abnormal; Notable for the following components:      Result Value   Specific Gravity, Urine <1.005 (*)    All other components within normal limits  CBC WITH DIFFERENTIAL/PLATELET - Abnormal; Notable for the following components:   Hemoglobin 10.5 (*)    HCT 35.3 (*)    MCV 76.2 (*)    MCH 22.7 (*)    MCHC 29.7 (*)    RDW 17.8 (*)    All other components within normal limits  BASIC METABOLIC PANEL - Abnormal; Notable for the following components:   Glucose, Bld 129 (*)    BUN 4 (*)    All other components within normal limits  URINE CULTURE  PREGNANCY, URINE    EKG None  Radiology CT Renal Stone Study  Result Date: 02/12/2020 CLINICAL DATA:  Left flank pain for several days initial encounter EXAM: CT ABDOMEN AND PELVIS WITHOUT CONTRAST TECHNIQUE: Multidetector CT imaging of the abdomen and pelvis was performed following the standard protocol without IV contrast. COMPARISON:  None. FINDINGS: Lower chest: No acute abnormality. Hepatobiliary: Fatty infiltration of the liver is noted. The gallbladder is decompressed although some mild dependent density is seen consistent with small stones in the fundus. Pancreas: Unremarkable. No pancreatic ductal dilatation or surrounding inflammatory changes. Spleen: Normal in size without focal abnormality. Adrenals/Urinary Tract: Adrenal glands are within normal limits. Kidneys demonstrate a normal appearance without evidence of renal calculi or obstructive change. The ureters are unremarkable. Bladder is decompressed. Stomach/Bowel: No obstructive or inflammatory changes of the colon are seen.  The appendix is within normal limits. Small bowel and stomach appear within normal limits. Vascular/Lymphatic: No significant vascular findings are present. No enlarged abdominal or pelvic lymph nodes. Reproductive: Uterus and bilateral adnexa are unremarkable. Other: No abdominal wall hernia or abnormality. No abdominopelvic ascites. Musculoskeletal: No acute or significant osseous findings IMPRESSION: Fatty liver. Cholelithiasis without complicating factors. No other focal abnormality is noted. Electronically Signed   By: Alcide Clever M.D.   On: 02/12/2020 16:25    Procedures Procedures (including critical care time)  Medications Ordered in ED Medications - No data to display  ED Course  I have reviewed the triage vital signs and the nursing notes.  Pertinent labs & imaging results that were available during my care of the patient were reviewed by me and considered in my medical decision making (see chart for details).    MDM Rules/Calculators/A&P                          Patient's history and physical exam is concerning for obstructing ureterolithiasis versus pyelonephritis.  Will obtain basic laboratory work-up given patient's reported weakness, chills, and fatigue in conjunction with her urinary symptoms and flank pain.  We will also proceed with CT renal stone study for further evaluation.  Labs CBC: Mild anemia with hemoglobin support 4, consistent with baseline.  No leukocytosis. BMP: Unremarkable. UA: No evidence to suggest infection.  No hematuria. Urine culture: Pending.   Urine pregnancy: Negative.  Imaging I personally reviewed CT renal stone study which revealed fatty liver disease and cholelithiasis without complicating factors.  No other acute intra-abdominal disease.  Will administer Toradol here in the ED for patient's pain symptoms.  She will follow up with her primary care provider for ongoing evaluation and management.  Do not feel as though treatment for UTI is  warranted given her reassuring UA.  If the urine culture comes back positive, she can then be prescribed antibiotics.  On subsequent evaluation, patient tells me that she is only been taking Tylenol for symptoms discomfort.  She works at a daycare.  I offered respiratory panel PCR testing, but she declined.  She is adamant that she has no cough or shortness of breath symptoms.  No pleuritic chest pain.  She is reassured by today's comprehensive work-up.  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  All of the evaluation and work-up results were discussed with the patient and any family at bedside.  Patient and/or family were informed that while patient is appropriate for discharge at this time, some medical emergencies may only develop or become detectable after a period of time.  I specifically instructed patient and/or family to return to return to the ED or seek immediate medical attention for any new or worsening symptoms.  They were provided opportunity to ask any additional questions and have none at this time.  Prior to discharge patient is feeling well, agreeable with plan for discharge home.  They have expressed understanding of verbal discharge instructions as well as return precautions and are agreeable to the plan.    Final Clinical Impression(s) / ED Diagnoses Final diagnoses:  Acute left flank pain    Rx / DC Orders ED Discharge Orders         Ordered    ibuprofen (ADVIL) 600 MG tablet  Every 6 hours PRN        02/12/20 1643    methocarbamol (ROBAXIN) 500 MG tablet  2 times daily PRN        02/12/20 1643           Lorelee New, PA-C 02/12/20 1644    Pollyann Savoy, MD 02/12/20 2256

## 2020-02-12 NOTE — ED Triage Notes (Signed)
Pt reports back pain, dysuria and urgency since wednesday

## 2020-02-12 NOTE — ED Notes (Signed)
Pt discharged to home. Discharge instructions have been discussed with patient and/or family members. Pt verbally acknowledges understanding d/c instructions, and endorses comprehension to checkout at registration before leaving.  °

## 2020-02-12 NOTE — ED Notes (Signed)
Patient verbally discharged

## 2020-02-12 NOTE — ED Notes (Signed)
ED Provider at bedside. 

## 2020-02-12 NOTE — Discharge Instructions (Addendum)
Please follow-up with your primary care provider, Dr. Hyacinth Meeker.  Your laboratory work-up and CT imaging obtained here in the ED was entirely reassuring.  No evidence to suggest infection at this time.  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  I recommend that you take ibuprofen 600 mg every 6 hours as needed for your symptoms of pain.  I have also prescribed you a course of Robaxin.  You were given a prescription for Robaxin which is a muscle relaxer.  You should not drive, work, consume alcohol, or operate machinery while taking this medication as it can make you very drowsy.    If your urine culture comes back positive, you will be contacted and an antibiotic will be prescribed.  Please return to the ED or seek immediate medical attention should you experience any new or worsening symptoms.

## 2020-02-14 LAB — URINE CULTURE: Culture: <10000 — AB

## 2020-05-10 ENCOUNTER — Other Ambulatory Visit: Payer: Self-pay

## 2020-05-10 ENCOUNTER — Emergency Department (HOSPITAL_BASED_OUTPATIENT_CLINIC_OR_DEPARTMENT_OTHER): Payer: BC Managed Care – PPO

## 2020-05-10 ENCOUNTER — Encounter (HOSPITAL_BASED_OUTPATIENT_CLINIC_OR_DEPARTMENT_OTHER): Payer: Self-pay

## 2020-05-10 ENCOUNTER — Emergency Department (HOSPITAL_BASED_OUTPATIENT_CLINIC_OR_DEPARTMENT_OTHER)
Admission: EM | Admit: 2020-05-10 | Discharge: 2020-05-10 | Disposition: A | Discharge status: Home / Self Care | Payer: BC Managed Care – PPO | Attending: Emergency Medicine | Admitting: Emergency Medicine

## 2020-05-10 DIAGNOSIS — R059 Cough, unspecified: Secondary | ICD-10-CM | POA: Diagnosis not present

## 2020-05-10 MED ORDER — BENZONATATE 100 MG PO CAPS: 100.0000 mg | ORAL_CAPSULE | Freq: Four times a day (QID) | ORAL | 0 refills | Status: DC | PRN

## 2020-05-10 NOTE — Discharge Instructions (Signed)
Return if any problems.  Continue to use your inhaler.  Use tessalon perles to help with cough

## 2020-05-10 NOTE — ED Triage Notes (Signed)
Pt c/o flu like sx x 3 weeks-states she tested neg covid 1/6-NAD-steady gait

## 2020-05-10 NOTE — ED Provider Notes (Signed)
[facility] EMERGENCY DEPARTMENT Provider Note   CSN: [id number] Arrival date & time: 05/10/20  1613     History Chief Complaint  Patient presents with  . Cough    Kathy Reyes is a 50 y.o. female.  The history is provided by the patient. No language interpreter was used.  Cough Cough characteristics:  Non-productive Sputum characteristics:  Nondescript Severity:  Moderate Duration:  4 weeks Timing:  Constant Chronicity:  New Relieved by:  Nothing Worsened by:  Nothing Ineffective treatments:  None tried Associated symptoms: no rhinorrhea   Risk factors: no recent infection        Past Medical History:  Diagnosis Date  . Anemia   . Anemia   . Blood transfusion without reported diagnosis   . HIV disease (HCC)     There are no problems to display for this patient.   Past Surgical History:  Procedure Laterality Date  . CESAREAN SECTION       OB History   No obstetric history on file.     Family History  Problem Relation Age of Onset  . Diabetes Father     Social History   Tobacco Use  . Smoking status: Never Smoker  . Smokeless tobacco: Never Used  Vaping Use  . Vaping Use: Never used  Substance Use Topics  . Alcohol use: No  . Drug use: No    Home Medications Prior to Admission medications   Medication Sig Start Date End Date Taking? Authorizing Provider  benzonatate (TESSALON PERLES) 100 MG capsule Take 1 capsule (100 mg total) by mouth every 6 (six) hours as needed for cough. 05/10/20 05/10/21 Yes Cheron Schaumann K, PA-C  abacavir-dolutegravir-lamiVUDine (TRIUMEQ) 600-50-300 MG tablet TAKE 1 TABLET BY MOUTH EVERY DAY 01/31/19   [provider]  amoxicillin-clavulanate (AUGMENTIN) 875-125 MG tablet Take 1 tablet by mouth every 12 (twelve) hours. 04/17/19   Gailen Shelter, PA  ferrous sulfate 325 (65 FE) MG EC tablet Take by mouth. 10/02/18   [provider]  ibuprofen (ADVIL) 600 MG tablet Take 1 tablet (600  mg total) by mouth every 6 (six) hours as needed for moderate pain. 02/12/20   Lorelee New, PA-C  methocarbamol (ROBAXIN) 500 MG tablet Take 1 tablet (500 mg total) by mouth 2 (two) times daily as needed for muscle spasms. 02/12/20   Lorelee New, PA-C    Allergies    Patient has no known allergies.  Review of Systems   Review of Systems  HENT: Negative for rhinorrhea.   Respiratory: Positive for cough.   All other systems reviewed and are negative.   Physical Exam Updated Vital Signs BP (!) 163/80 (BP Location: Left Arm)   Pulse 93   Temp 98.5 F (36.9 C) (Oral)   Resp 18   SpO2 100%   Physical Exam Vitals and nursing note reviewed.  Constitutional:      Appearance: She is well-developed and well-nourished.  HENT:     Head: Normocephalic.  Eyes:     Extraocular Movements: EOM normal.  Cardiovascular:     Rate and Rhythm: Normal rate.  Pulmonary:     Effort: Pulmonary effort is normal.  Abdominal:     General: There is no distension.  Musculoskeletal:        General: Normal range of motion.     Cervical back: Normal range of motion.  Skin:    General: Skin is warm.  Neurological:     General: No focal deficit  present.     Mental Status: She is alert and oriented to person, place, and time.  Psychiatric:        Mood and Affect: Mood and affect and mood normal.     ED Results / Procedures / Treatments   Labs (all labs ordered are listed, but only abnormal results are displayed) Labs Reviewed - No data to display  EKG None  Radiology DG Chest 2 View  Result Date: 05/10/2020 CLINICAL DATA:  Cough COVID EXAM: CHEST - 2 VIEW COMPARISON:  06/08/2016 FINDINGS: The heart size and mediastinal contours are within normal limits. Both lungs are clear. The visualized skeletal structures are unremarkable. IMPRESSION: No active cardiopulmonary disease. Electronically Signed   By: Jasmine Pang M.D.   On: 05/10/2020 18:55    Procedures Procedures (including  critical care time)  Medications Ordered in ED Medications - No data to display  ED Course  I have reviewed the triage vital signs and the nursing notes.  Pertinent labs & imaging results that were available during my care of the patient were reviewed by me and considered in my medical decision making (see chart for details).    MDM Rules/Calculators/A&P                          MDM:  Chest xray no pneumonia  Pt advised to follow up with her MD.  Pt given rx for tessalon  Final Clinical Impression(s) / ED Diagnoses Final diagnoses:  Cough    Rx / DC Orders ED Discharge Orders         Ordered    benzonatate (TESSALON PERLES) 100 MG capsule  Every 6 hours PRN        05/10/20 1908        An After Visit Summary was printed and given to the patient.    Elson Areas, PA-C 05/10/20 1915    Virgina Norfolk, DO 05/10/20 2042

## 2020-05-10 NOTE — ED Triage Notes (Signed)
Pt states she tested POSITIVE for COVID on 1/6 (error in triage note). States had a cough for a week prior to testing positive

## 2020-09-25 ENCOUNTER — Encounter (HOSPITAL_COMMUNITY): Payer: Self-pay | Admitting: *Deleted

## 2020-09-25 ENCOUNTER — Emergency Department (HOSPITAL_COMMUNITY): Payer: BC Managed Care – PPO

## 2020-09-25 ENCOUNTER — Emergency Department (HOSPITAL_COMMUNITY)
Admission: EM | Admit: 2020-09-25 | Discharge: 2020-09-26 | Disposition: A | Discharge status: Home / Self Care | Payer: BC Managed Care – PPO | Attending: Emergency Medicine | Admitting: Emergency Medicine

## 2020-09-25 ENCOUNTER — Other Ambulatory Visit: Payer: Self-pay

## 2020-09-25 DIAGNOSIS — Z8616 Personal history of COVID-19: Secondary | ICD-10-CM | POA: Insufficient documentation

## 2020-09-25 DIAGNOSIS — J029 Acute pharyngitis, unspecified: Secondary | ICD-10-CM | POA: Insufficient documentation

## 2020-09-25 DIAGNOSIS — R0981 Nasal congestion: Secondary | ICD-10-CM | POA: Insufficient documentation

## 2020-09-25 DIAGNOSIS — J3489 Other specified disorders of nose and nasal sinuses: Secondary | ICD-10-CM | POA: Diagnosis not present

## 2020-09-25 DIAGNOSIS — R5383 Other fatigue: Secondary | ICD-10-CM | POA: Diagnosis not present

## 2020-09-25 DIAGNOSIS — R1110 Vomiting, unspecified: Secondary | ICD-10-CM | POA: Insufficient documentation

## 2020-09-25 DIAGNOSIS — Z20822 Contact with and (suspected) exposure to covid-19: Secondary | ICD-10-CM | POA: Insufficient documentation

## 2020-09-25 DIAGNOSIS — R059 Cough, unspecified: Secondary | ICD-10-CM | POA: Insufficient documentation

## 2020-09-25 DIAGNOSIS — Z79899 Other long term (current) drug therapy: Secondary | ICD-10-CM | POA: Insufficient documentation

## 2020-09-25 DIAGNOSIS — Z21 Asymptomatic human immunodeficiency virus [HIV] infection status: Secondary | ICD-10-CM | POA: Insufficient documentation

## 2020-09-25 LAB — CBC WITH DIFFERENTIAL/PLATELET
Abs Immature Granulocytes: 0.04 10*3/uL (ref 0.00–0.07)
Basophils Absolute: 0.1 10*3/uL (ref 0.0–0.1)
Basophils Relative: 1 %
Eosinophils Absolute: 0.1 10*3/uL (ref 0.0–0.5)
Eosinophils Relative: 2 %
HCT: 34 % — ABNORMAL LOW (ref 36.0–46.0)
Hemoglobin: 10 g/dL — ABNORMAL LOW (ref 12.0–15.0)
Immature Granulocytes: 1 %
Lymphocytes Relative: 32 %
Lymphs Abs: 1.6 10*3/uL (ref 0.7–4.0)
MCH: 23.7 pg — ABNORMAL LOW (ref 26.0–34.0)
MCHC: 29.4 g/dL — ABNORMAL LOW (ref 30.0–36.0)
MCV: 80.6 fL (ref 80.0–100.0)
Monocytes Absolute: 0.3 10*3/uL (ref 0.1–1.0)
Monocytes Relative: 6 %
Neutro Abs: 2.8 10*3/uL (ref 1.7–7.7)
Neutrophils Relative %: 58 %
Platelets: 254 10*3/uL (ref 150–400)
RBC: 4.22 MIL/uL (ref 3.87–5.11)
RDW: 17.6 % — ABNORMAL HIGH (ref 11.5–15.5)
WBC: 4.8 10*3/uL (ref 4.0–10.5)
nRBC: 0 % (ref 0.0–0.2)

## 2020-09-25 LAB — BASIC METABOLIC PANEL
Anion gap: 6 (ref 5–15)
BUN: <5 mg/dL — ABNORMAL LOW (ref 6–20)
CO2: 24 mmol/L (ref 22–32)
Calcium: 8.9 mg/dL (ref 8.9–10.3)
Chloride: 107 mmol/L (ref 98–111)
Creatinine, Ser: 0.55 mg/dL (ref 0.44–1.00)
GFR, Estimated: >60 mL/min (ref >60)
Glucose, Bld: 136 mg/dL — ABNORMAL HIGH (ref 70–99)
Potassium: 3.6 mmol/L (ref 3.5–5.1)
Sodium: 137 mmol/L (ref 135–145)

## 2020-09-25 MED ORDER — HYDROCODONE BIT-HOMATROP MBR 5-1.5 MG/5ML PO SOLN: 5.0000 mL | Freq: Four times a day (QID) | ORAL | 0 refills | Status: DC | PRN

## 2020-09-25 NOTE — ED Triage Notes (Signed)
Pt arrived by [facility]. Having cough and chest wall pain x 5 days ago. Received ASA pta.

## 2020-09-25 NOTE — Discharge Instructions (Addendum)
You were seen in the ER for cough runny nose sore throat chest wall pain chills  Chest x-ray was normal. Labs were reassuring.  COVID and influenza results are pending.    Your symptoms may be from a virus or upper respiratory infection  Please take over the counter dextromethorphan (Delsym) every 12 hours (morning and evening).  I have prescribed you hycodan syrup. This has hydrocodone for chest wall pain and homatropine which is an anti cough medicine. Take this every 6 hours.  Caution with this medicine as it has hydrocodone which is an opioid medicine. Do not leave unattended around children and do not take with alcohol or other sedating medicines.    The main treatment approach for a viral illness is supportive, addressing your symptoms, support your immune system and prevent spread of illness.    Stay well-hydrated. Rest. You can use over the counter medications to help with symptoms: 600 mg ibuprofen (motrin, aleve, advil) or acetaminophen (tylenol) every 6 hours, around the clock to help with associated fevers, sore throat, headaches, generalized body aches and malaise. Do not take ibuprofen if you are pregnant, have kidney disease, have stomach ulcers or severe gastritis, or are on anticoagulants. Do not take acetaminophen if you have liver disease. Oxymetazoline (afrin) intranasal spray once daily for no more than 3 days to help with congestion, after 3 days you can switch to another over-the-counter nasal steroid spray such as fluticasone (flonase) Allergy medication (loratadine, cetirizine, etc) and phenylephrine (sudafed) help with nasal congestion, runny nose and postnasal drip.   Wash your hands often to prevent spread.    A viral illness usually resolved in 7-10 days.  A viral illness can also worsen and progress into pneumonia, severe dehydration.  Monitor your symptoms. Return for persistent fevers, chest pain or shortness of breath with activity or exertion, worsening productive or  bloody cough, inability to tolerate fluids despite nausea medicines or dehydration, severe non stop vomiting or diarrhea, blood in your vomit or stool

## 2020-09-25 NOTE — ED Provider Notes (Signed)
Emergency Medicine Provider Triage Evaluation Note  Kathy Reyes , a 50 y.o. female  was evaluated in triage.  Pt complains of COUGH, CHEST PAIN, myalgias, body aches, chills, subjective fevers, recent work-up negative for COVID.Marland Kitchen  Review of Systems  Positive: COUGH, CP Negative: N/V/D  Physical Exam  There were no vitals taken for this visit. Gen:   Awake, no distress   Resp:  Normal effort  MSK:   Moves extremities without difficulty  Other:  Appears ill, cough  Medical Decision Making  Medically screening exam initiated at 3:27 PM.  Appropriate orders placed.  Glender I Latorre was informed that the remainder of the evaluation will be completed by another provider, this initial triage assessment does not replace that evaluation, and the importance of remaining in the ED until their evaluation is complete.  Cough, cp, work up initaite (? Hx of HIV- on antiretroviral meds)   Arthor Captain, PA-C 09/25/20 1529    Arby Barrette, MD 09/28/20 2351

## 2020-09-25 NOTE — ED Provider Notes (Signed)
[facility] EMERGENCY DEPARTMENT Provider Note   CSN: [id number] Arrival date & time: 09/25/20  1509     History Chief Complaint  Patient presents with  . Chest Pain  . Cough    Kathy Reyes is a 50 y.o. female with history of HIV on Triumeq, anemia presents to the ED for evaluation of cough for the last 5 days.  Described as constant, forceful and painful.  Has not been able to sleep because she is coughing throughout the night.  Has central sharp chest pain with coughing, breathing and palpation.  Developed sputum that is nonbloody 2 days ago.  Has felt fatigue, runny nose, sore throat and chills.  No fever, shortness of breath, abdominal pain, diarrhea.  Has had several episodes of vomiting due to forceful coughing but denies any nausea.  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  She had COVID infection in January.  Has since been vaccinated for it as well.  Had the flu shot in October.  States she is compliant with her HIV medicines and at her last blood draw her levels were undetectable.  No history of PCP.  No hemoptysis.   HPI     Past Medical History:  Diagnosis Date  . Anemia   . Anemia   . Blood transfusion without reported diagnosis   . HIV disease (HCC)     There are no problems to display for this patient.   Past Surgical History:  Procedure Laterality Date  . CESAREAN SECTION       OB History   No obstetric history on file.     Family History  Problem Relation Age of Onset  . Diabetes Father     Social History   Tobacco Use  . Smoking status: Never Smoker  . Smokeless tobacco: Never Used  Vaping Use  . Vaping Use: Never used  Substance Use Topics  . Alcohol use: No  . Drug use: No    Home Medications Prior to Admission medications   Medication Sig Start Date End Date Taking? Authorizing Provider  HYDROcodone bit-homatropine (HYCODAN) 5-1.5 MG/5ML syrup Take 5 mLs by mouth every 6 (six) hours as needed for cough.  09/25/20  Yes Sharen Heck J, PA-C  abacavir-dolutegravir-lamiVUDine (TRIUMEQ) 600-50-300 MG tablet TAKE 1 TABLET BY MOUTH EVERY DAY 01/31/19   [provider]  amoxicillin-clavulanate (AUGMENTIN) 875-125 MG tablet Take 1 tablet by mouth every 12 (twelve) hours. 04/17/19   Gailen Shelter, PA  ferrous sulfate 325 (65 FE) MG EC tablet Take by mouth. 10/02/18   [provider]  ibuprofen (ADVIL) 600 MG tablet Take 1 tablet (600 mg total) by mouth every 6 (six) hours as needed for moderate pain. 02/12/20   Lorelee New, PA-C  methocarbamol (ROBAXIN) 500 MG tablet Take 1 tablet (500 mg total) by mouth 2 (two) times daily as needed for muscle spasms. 02/12/20   Lorelee New, PA-C    Allergies    Patient has no known allergies.  Review of Systems   Review of Systems  Constitutional: Positive for chills and fatigue.  HENT: Positive for rhinorrhea and sore throat.   Respiratory: Positive for cough.   Cardiovascular: Positive for chest pain.  Gastrointestinal: Positive for vomiting.  All other systems reviewed and are negative.   Physical Exam Updated Vital Signs BP 107/60   Pulse 70   Temp 98.4 F (36.9 C) (Oral)   Resp 20   SpO2 100%   Physical  Exam Vitals and nursing note reviewed.  Constitutional:      General: She is not in acute distress.    Appearance: She is well-developed.     Comments: NAD.  HENT:     Head: Normocephalic and atraumatic.     Right Ear: External ear normal.     Left Ear: External ear normal.     Nose: Mucosal edema and rhinorrhea present.     Mouth/Throat:     Pharynx: Posterior oropharyngeal erythema present.     Comments: Diffuse oropharyngeal, tonsillar erythema.  No exudates, petechia.  Uvula midline.  No significant tonsillar hypertrophy.  Normal sublingual space, tongue protrusion, phonation.  Tolerating secretions.  No stridor. Eyes:     General: No scleral icterus.    Conjunctiva/sclera: Conjunctivae normal.   Cardiovascular:     Rate and Rhythm: Normal rate and regular rhythm.     Heart sounds: Normal heart sounds. No murmur heard.     Comments: No lower extremity edema.  No calf tenderness. Pulmonary:     Effort: Pulmonary effort is normal.     Breath sounds: Normal breath sounds. No wheezing.  Chest:  Breasts:     Left: Tenderness present.      Comments: Bilateral parasternal chest wall tenderness. Musculoskeletal:        General: No deformity. Normal range of motion.     Cervical back: Normal range of motion and neck supple.  Skin:    General: Skin is warm and dry.     Capillary Refill: Capillary refill takes less than 2 seconds.  Neurological:     Mental Status: She is alert and oriented to person, place, and time.  Psychiatric:        Behavior: Behavior normal.        Thought Content: Thought content normal.        Judgment: Judgment normal.     ED Results / Procedures / Treatments   Labs (all labs ordered are listed, but only abnormal results are displayed) Labs Reviewed  BASIC METABOLIC PANEL - Abnormal; Notable for the following components:      Result Value   Glucose, Bld 136 (*)    BUN <5 (*)    All other components within normal limits  CBC WITH DIFFERENTIAL/PLATELET - Abnormal; Notable for the following components:   Hemoglobin 10.0 (*)    HCT 34.0 (*)    MCH 23.7 (*)    MCHC 29.4 (*)    RDW 17.6 (*)    All other components within normal limits  RESP PANEL BY RT-PCR (FLU A&B, COVID) [id number]    EKG None  Radiology DG Chest 2 View  Result Date: 09/25/2020 CLINICAL DATA:  Cough.  Chest pain. EXAM: CHEST - 2 VIEW COMPARISON:  05/10/2020 FINDINGS: The lungs are clear without focal pneumonia, edema, pneumothorax or pleural effusion. The cardiopericardial silhouette is within normal limits for size. The visualized bony structures of the thorax show no acute abnormality. IMPRESSION: No active cardiopulmonary disease. Electronically Signed   By: Kennith Center M.D.    On: 09/25/2020 16:01    Procedures Procedures   Medications Ordered in ED Medications - No data to display  ED Course  I have reviewed the triage vital signs and the nursing notes.  Pertinent labs & imaging results that were available during my care of the patient were reviewed by me and considered in my medical decision making (see chart for details).  Clinical Course as of 09/25/20 2356  Mon Sep 25, 2020  2130 Hemoglobin(!): 10.0 [CG]    Clinical Course User Index [CG] Liberty Handy, PA-C   MDM Rules/Calculators/A&P                           50 y.o. yo female presents to the ED for evaluation of cough, posttussive emesis, rhinorrhea, sore throat, sharp chest pain with coughing, breathing, movement.  History of HIV compliant with her medicines.  Had COVID in January.  Works in a daycare.  No red flags like fever, exertional shortness of breath, hemoptysis.  Additional information obtained from chart, nursing and triage notes review  Chart review reveals -HIV viral load undetectable September 2021, CD4 count 660.  Ordered lab, imaging were personally reviewed and interpreted.  Lab work, imaging ordered by [employer] provider.  Labs reveal -stable anemia.  Normal WBC.  Pending respiratory panel.  Imaging reveals -chest x-ray is unremarkable.  ED course & MDM 2355: Clinical presentation is most consistent with likely viral URI.  No evidence of pneumonia on chest x-ray.  Vital signs are completely normal.  No hypoxia.  Normal WBC.  She ambulated here without hypoxia.  Doubt PCP.  She has no known history of lung disease like COPD, tobacco use, asthma.  Doubt bacterial bronchitis at this point and will defer antibiotics.  Considered ACS, PE highly unlikely given other URI symptoms. Respiratory panel is pending although patient had COVID 6 months ago.  Could be influenza or RSV given that she works in a daycare.  This was explained to patient.  Will discharge with Hycodan, Delsym,  NSAIDs for chest wall pain and sore throat.  Return precautions given.  Patient is comfortable this plan.  Portions of this note were generated with Scientist, clinical (histocompatibility and immunogenetics). Dictation errors may occur despite best attempts at proofreading   Final Clinical Impression(s) / ED Diagnoses Final diagnoses:  Cough    Rx / DC Orders ED Discharge Orders         Ordered    HYDROcodone bit-homatropine (HYCODAN) 5-1.5 MG/5ML syrup  Every 6 hours PRN        09/25/20 2346           Liberty Handy, PA-C 09/25/20 2356    Wynetta Fines, MD 09/27/20 1401

## 2020-09-25 NOTE — ED Notes (Signed)
SPO2 98%-100% during pt ambulation

## 2020-09-26 LAB — RESP PANEL BY RT-PCR (FLU A&B, COVID) ARPGX2
Influenza A by PCR: NEGATIVE
Influenza B by PCR: NEGATIVE
SARS Coronavirus 2 by RT PCR: NEGATIVE

## 2020-10-02 ENCOUNTER — Other Ambulatory Visit: Payer: Self-pay | Admitting: Family

## 2020-10-02 DIAGNOSIS — B20 Human immunodeficiency virus [HIV] disease: Secondary | ICD-10-CM

## 2021-03-09 ENCOUNTER — Encounter (HOSPITAL_BASED_OUTPATIENT_CLINIC_OR_DEPARTMENT_OTHER): Payer: Self-pay

## 2021-03-09 ENCOUNTER — Emergency Department (HOSPITAL_BASED_OUTPATIENT_CLINIC_OR_DEPARTMENT_OTHER)
Admission: EM | Admit: 2021-03-09 | Discharge: 2021-03-09 | Disposition: A | Discharge status: Home / Self Care | Payer: 59 | Attending: Emergency Medicine | Admitting: Emergency Medicine

## 2021-03-09 ENCOUNTER — Emergency Department (HOSPITAL_BASED_OUTPATIENT_CLINIC_OR_DEPARTMENT_OTHER): Payer: 59

## 2021-03-09 ENCOUNTER — Other Ambulatory Visit: Payer: Self-pay

## 2021-03-09 DIAGNOSIS — R197 Diarrhea, unspecified: Secondary | ICD-10-CM | POA: Insufficient documentation

## 2021-03-09 DIAGNOSIS — Z79899 Other long term (current) drug therapy: Secondary | ICD-10-CM | POA: Insufficient documentation

## 2021-03-09 DIAGNOSIS — Z21 Asymptomatic human immunodeficiency virus [HIV] infection status: Secondary | ICD-10-CM | POA: Diagnosis not present

## 2021-03-09 DIAGNOSIS — R109 Unspecified abdominal pain: Secondary | ICD-10-CM | POA: Diagnosis present

## 2021-03-09 DIAGNOSIS — R112 Nausea with vomiting, unspecified: Secondary | ICD-10-CM | POA: Insufficient documentation

## 2021-03-09 LAB — COMPREHENSIVE METABOLIC PANEL
ALT: 19 U/L (ref 0–44)
AST: 32 U/L (ref 15–41)
Albumin: 3.8 g/dL (ref 3.5–5.0)
Alkaline Phosphatase: 87 U/L (ref 38–126)
Anion gap: 10 (ref 5–15)
BUN: <5 mg/dL — ABNORMAL LOW (ref 6–20)
CO2: 18 mmol/L — ABNORMAL LOW (ref 22–32)
Calcium: 9 mg/dL (ref 8.9–10.3)
Chloride: 106 mmol/L (ref 98–111)
Creatinine, Ser: 0.73 mg/dL (ref 0.44–1.00)
GFR, Estimated: >60 mL/min (ref >60)
Glucose, Bld: 113 mg/dL — ABNORMAL HIGH (ref 70–99)
Potassium: 4 mmol/L (ref 3.5–5.1)
Sodium: 134 mmol/L — ABNORMAL LOW (ref 135–145)
Total Bilirubin: 0.3 mg/dL (ref 0.3–1.2)
Total Protein: 8.5 g/dL — ABNORMAL HIGH (ref 6.5–8.1)

## 2021-03-09 LAB — LIPASE, BLOOD: Lipase: 21 U/L (ref 11–51)

## 2021-03-09 LAB — URINALYSIS, ROUTINE W REFLEX MICROSCOPIC
Bilirubin Urine: NEGATIVE
Glucose, UA: NEGATIVE mg/dL
Hgb urine dipstick: NEGATIVE
Ketones, ur: NEGATIVE mg/dL
Leukocytes,Ua: NEGATIVE
Nitrite: NEGATIVE
Protein, ur: NEGATIVE mg/dL
Specific Gravity, Urine: 1.005 (ref 1.005–1.030)
pH: 6.5 (ref 5.0–8.0)

## 2021-03-09 LAB — PREGNANCY, URINE: Preg Test, Ur: NEGATIVE

## 2021-03-09 MED ORDER — ONDANSETRON HCL 4 MG/2ML IJ SOLN
4.0000 mg | Freq: Once | INTRAMUSCULAR | Status: AC
  Administered 2021-03-09: 4 mg via INTRAVENOUS
  Filled 2021-03-09: qty 2

## 2021-03-09 MED ORDER — ONDANSETRON 4 MG PO TBDP: ORAL_TABLET | ORAL | 0 refills | Status: DC

## 2021-03-09 MED ORDER — SODIUM CHLORIDE 0.9 % IV BOLUS
1000.0000 mL | Freq: Once | INTRAVENOUS | Status: AC
  Administered 2021-03-09: 1000 mL via INTRAVENOUS

## 2021-03-09 NOTE — Discharge Instructions (Signed)
Stay hydrated.  You likely have a stomach virus  Take Zofran for nausea  See your doctor for follow up   Return to ER if you have severe abdominal pain, vomiting, dehydration, fever

## 2021-03-09 NOTE — ED Triage Notes (Signed)
Pt arrives ambulatory to ED with c/o abdominal cramps X5 days and diarrhea X 5 days. Pain worsens after eating with a 'different taste' in her mouth when eating. Denies urinary symptoms.

## 2021-03-09 NOTE — ED Notes (Signed)
Lab collect x2 in triage unsuccessful.  Chemistry tube to lab for analysis.  Patient tolerated well.

## 2021-03-09 NOTE — ED Notes (Signed)
Unable to obtain labs, stuck pt x2

## 2021-03-09 NOTE — ED Provider Notes (Signed)
[facility] EMERGENCY DEPARTMENT Provider Note   CSN: [id number] Arrival date & time: 03/09/21  1753     History Chief Complaint  Patient presents with   Abdominal Pain    Kathy Reyes is a 50 y.o. female hx of HIV (normal CD4 count a year ago), here presenting with abdominal pain and vomiting and diarrhea.  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  She states that for the last several days, she has been having abdominal cramps.  She states that is worse after she eats.  She states that she had several episodes of vomiting.  She also has some diarrhea after she eats.  Denies any fever.  She has a C-section but denies any history of SBO in the past  The history is provided by the patient.      Past Medical History:  Diagnosis Date   Anemia    Anemia    Blood transfusion without reported diagnosis    HIV disease (HCC)     There are no problems to display for this patient.   Past Surgical History:  Procedure Laterality Date   CESAREAN SECTION       OB History   No obstetric history on file.     Family History  Problem Relation Age of Onset   Diabetes Father     Social History   Tobacco Use   Smoking status: Never   Smokeless tobacco: Never  Vaping Use   Vaping Use: Never used  Substance Use Topics   Alcohol use: No   Drug use: No    Home Medications Prior to Admission medications   Medication Sig Start Date End Date Taking? Authorizing Provider  ondansetron (ZOFRAN ODT) 4 MG disintegrating tablet 4mg  ODT q4 hours prn nausea/vomit 03/09/21  Yes 03/11/21, MD  abacavir-dolutegravir-lamiVUDine (TRIUMEQ) 600-50-300 MG tablet TAKE 1 TABLET BY MOUTH EVERY DAY 01/31/19   [provider]  amoxicillin-clavulanate (AUGMENTIN) 875-125 MG tablet Take 1 tablet by mouth every 12 (twelve) hours. 04/17/19   04/19/19, PA  ferrous sulfate 325 (65 FE) MG EC tablet Take by mouth. 10/02/18   [provider]  HYDROcodone  bit-homatropine (HYCODAN) 5-1.5 MG/5ML syrup Take 5 mLs by mouth every 6 (six) hours as needed for cough. 09/25/20   11/25/20, PA-C  ibuprofen (ADVIL) 600 MG tablet Take 1 tablet (600 mg total) by mouth every 6 (six) hours as needed for moderate pain. 02/12/20   02/14/20, PA-C  methocarbamol (ROBAXIN) 500 MG tablet Take 1 tablet (500 mg total) by mouth 2 (two) times daily as needed for muscle spasms. 02/12/20   02/14/20, PA-C    Allergies    Patient has no known allergies.  Review of Systems   Review of Systems  Gastrointestinal:  Positive for abdominal pain, diarrhea and vomiting.  All other systems reviewed and are negative.  Physical Exam Updated Vital Signs BP (!) 103/57   Pulse 76   Temp 98.2 F (36.8 C) (Oral)   Resp 18   Ht 5\' 4"  (1.626 m)   Wt 77.1 kg   SpO2 100%   BMI 29.18 kg/m   Physical Exam Vitals and nursing note reviewed.  Constitutional:      Comments: Slightly dehydrated  HENT:     Head: Normocephalic.     Mouth/Throat:     Mouth: Mucous membranes are moist.  Eyes:     Extraocular Movements: Extraocular movements intact.  Pupils: Pupils are equal, round, and reactive to light.  Cardiovascular:     Rate and Rhythm: Normal rate and regular rhythm.  Abdominal:     General: Abdomen is flat.     Comments: Slightly distended, nontender  Skin:    General: Skin is warm.     Capillary Refill: Capillary refill takes less than 2 seconds.  Neurological:     General: No focal deficit present.     Mental Status: She is oriented to person, place, and time.  Psychiatric:        Mood and Affect: Mood normal.        Behavior: Behavior normal.    ED Results / Procedures / Treatments   Labs (all labs ordered are listed, but only abnormal results are displayed) Labs Reviewed  COMPREHENSIVE METABOLIC PANEL - Abnormal; Notable for the following components:      Result Value   Sodium 134 (*)    CO2 18 (*)    Glucose, Bld 113 (*)    BUN  <5 (*)    Total Protein 8.5 (*)    All other components within normal limits  URINALYSIS, ROUTINE W REFLEX MICROSCOPIC - Abnormal; Notable for the following components:   Color, Urine AMBER (*)    All other components within normal limits  LIPASE, BLOOD  PREGNANCY, URINE    EKG None  Radiology DG ABD ACUTE 2+V W 1V CHEST  Result Date: 03/09/2021 CLINICAL DATA:  Abdominal cramps and diarrhea x5 days. EXAM: DG ABDOMEN ACUTE WITH 1 VIEW CHEST COMPARISON:  June 20, 2014 FINDINGS: There is no evidence of dilated bowel loops or free intraperitoneal air. No radiopaque calculi or other significant radiographic abnormality is seen. Heart size and mediastinal contours are within normal limits. Both lungs are clear. IMPRESSION: Negative abdominal radiographs.  No acute cardiopulmonary disease. Electronically Signed   By: Aram Candela M.D.   On: 03/09/2021 21:21    Procedures Procedures   Medications Ordered in ED Medications  sodium chloride 0.9 % bolus 1,000 mL (0 mLs Intravenous Stopped 03/09/21 2211)  ondansetron (ZOFRAN) injection 4 mg (4 mg Intravenous Given 03/09/21 2057)    ED Course  I have reviewed the triage vital signs and the nursing notes.  Pertinent labs & imaging results that were available during my care of the patient were reviewed by me and considered in my medical decision making (see chart for details).    MDM Rules/Calculators/A&P                           [name] is a 50 y.o. female here presenting with vomiting and diarrhea.  I think likely gastroenteritis.  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  She states that multiple kids were sick with similar symptoms.  Patient CBC is unremarkable but CMP showed bicarb of 18 with normal anion gap.  I think this is consistent with mild dehydration.  X-ray did not show any obvious SBO and has low suspicion for SBO.  Patient given IV fluids and Zofran and felt better.  Stable for  discharge   Final Clinical Impression(s) / ED Diagnoses Final diagnoses:  Abdominal pain, unspecified abdominal location  Nausea vomiting and diarrhea    Rx / DC Orders ED Discharge Orders          Ordered    ondansetron (ZOFRAN ODT) 4 MG disintegrating tablet        03/09/21  2253             Charlynne Pander, MD 03/09/21 2258

## 2021-07-13 ENCOUNTER — Emergency Department (HOSPITAL_BASED_OUTPATIENT_CLINIC_OR_DEPARTMENT_OTHER)
Admission: EM | Admit: 2021-07-13 | Discharge: 2021-07-13 | Disposition: A | Discharge status: Other Acute Inpatient Hospital | Payer: 59 | Attending: Emergency Medicine | Admitting: Emergency Medicine

## 2021-07-13 ENCOUNTER — Encounter (HOSPITAL_BASED_OUTPATIENT_CLINIC_OR_DEPARTMENT_OTHER): Payer: Self-pay

## 2021-07-13 ENCOUNTER — Other Ambulatory Visit: Payer: Self-pay

## 2021-07-13 DIAGNOSIS — R6883 Chills (without fever): Secondary | ICD-10-CM | POA: Diagnosis not present

## 2021-07-13 DIAGNOSIS — M791 Myalgia, unspecified site: Secondary | ICD-10-CM | POA: Insufficient documentation

## 2021-07-13 DIAGNOSIS — J029 Acute pharyngitis, unspecified: Secondary | ICD-10-CM

## 2021-07-13 DIAGNOSIS — R5381 Other malaise: Secondary | ICD-10-CM | POA: Diagnosis not present

## 2021-07-13 DIAGNOSIS — Z20822 Contact with and (suspected) exposure to covid-19: Secondary | ICD-10-CM | POA: Insufficient documentation

## 2021-07-13 LAB — RESP PANEL BY RT-PCR (FLU A&B, COVID) ARPGX2
Influenza A by PCR: NEGATIVE
Influenza B by PCR: NEGATIVE
SARS Coronavirus 2 by RT PCR: NEGATIVE

## 2021-07-13 LAB — GROUP A STREP BY PCR: Group A Strep by PCR: NOT DETECTED

## 2021-07-13 MED ORDER — LIDOCAINE VISCOUS HCL 2 % MT SOLN
15.0000 mL | Freq: Once | OROMUCOSAL | Status: AC
  Administered 2021-07-13: 15 mL via OROMUCOSAL
  Filled 2021-07-13: qty 15

## 2021-07-13 NOTE — ED Provider Notes (Signed)
?[facility] EMERGENCY DEPARTMENT ?Provider Note ? ? ?CSN: [id number] ?Arrival date & time: 07/13/21  1235 ? ?  ? ?History ?Chief Complaint  ?Patient presents with  ? Sore Throat  ? ? ?Kathy Reyes is a 51 y.o. female who presents the emergency department with a 3-day history of sore throat.  Patient reports associated chills and general malaise and myalgias.  No abdominal pain, nausea, vomiting, diarrhea.  No chest pain, shortness of breath, or cough. ? ? ?Sore Throat ? ? ?  ? ?Home Medications ?Prior to Admission medications   ?Medication Sig Start Date End Date Taking? Authorizing Provider  ?abacavir-dolutegravir-lamiVUDine (TRIUMEQ) 600-50-300 MG tablet TAKE 1 TABLET BY MOUTH EVERY DAY 01/31/19   [provider]  ?amoxicillin-clavulanate (AUGMENTIN) 875-125 MG tablet Take 1 tablet by mouth every 12 (twelve) hours. 04/17/19   Gailen Shelter, PA  ?ferrous sulfate 325 (65 FE) MG EC tablet Take by mouth. 10/02/18   [provider]  ?HYDROcodone bit-homatropine (HYCODAN) 5-1.5 MG/5ML syrup Take 5 mLs by mouth every 6 (six) hours as needed for cough. 09/25/20   Liberty Handy, PA-C  ?ibuprofen (ADVIL) 600 MG tablet Take 1 tablet (600 mg total) by mouth every 6 (six) hours as needed for moderate pain. 02/12/20   Lorelee New, PA-C  ?methocarbamol (ROBAXIN) 500 MG tablet Take 1 tablet (500 mg total) by mouth 2 (two) times daily as needed for muscle spasms. 02/12/20   Lorelee New, PA-C  ?ondansetron (ZOFRAN ODT) 4 MG disintegrating tablet 4mg  ODT q4 hours prn nausea/vomit 03/09/21   03/11/21, MD  ?   ? ?Allergies    ?Patient has no known allergies.   ? ?Review of Systems   ?Review of Systems  ?All other systems reviewed and are negative. ? ?Physical Exam ?Updated Vital Signs ?BP 127/79 (BP Location: Left Arm)   Pulse 83   Temp 98.5 ?F (36.9 ?C) (Oral)   Resp 17   Ht 5\' 4"  (1.626 m)   Wt 81.2 kg   SpO2 99%   BMI 30.73 kg/m?  ?Physical Exam ?Vitals and nursing  note reviewed.  ?Constitutional:   ?   Appearance: Normal appearance.  ?HENT:  ?   Head: Normocephalic and atraumatic.  ?   Mouth/Throat:  ?   Comments: Uvula is midline.  Mild posterior pharyngeal erythema.  No tonsillar hypertrophy or exudate. ?Eyes:  ?   General:     ?   Right eye: No discharge.     ?   Left eye: No discharge.  ?   Conjunctiva/sclera: Conjunctivae normal.  ?Pulmonary:  ?   Effort: Pulmonary effort is normal.  ?Skin: ?   General: Skin is warm and dry.  ?   Findings: No rash.  ?Neurological:  ?   General: No focal deficit present.  ?   Mental Status: She is alert.  ?Psychiatric:     ?   Mood and Affect: Mood normal.     ?   Behavior: Behavior normal.  ? ? ?ED Results / Procedures / Treatments   ?Labs ?(all labs ordered are listed, but only abnormal results are displayed) ?Labs Reviewed  ?RESP PANEL BY RT-PCR (FLU A&B, COVID) ARPGX2  ?GROUP A STREP BY PCR  ? ? ?EKG ?None ? ?Radiology ?No results found. ? ?Procedures ?Procedures  ? ? ?Medications Ordered in ED ?Medications  ?lidocaine (XYLOCAINE) 2 % viscous mouth solution 15 mL (15 mLs Mouth/Throat Given 07/13/21 1440)  ? ? ?ED Course/  Medical Decision Making/ A&P ?  ?                        ?Medical Decision Making ?Risk ?Prescription drug management. ? ? ?Kathy Reyes is a 51 y.o. female who presents emergency part with a sore throat.  Have a low suspicion for PTA, RPA, and strep pharyngitis at this time.  No tonsillar exudate.  Patient is nontoxic-appearing and in no acute distress.  I personally ordered and interpreted labs including respiratory panel which was negative for COVID and flu.  Strep PCR was negative.  Patient feels much better after viscous lidocaine.  I discussed with her to get Chloraseptic throat spray over-the-counter and take Tylenol as needed for pain.  Patient expressed that she is currently fasting as she is Muslim and asked if this would be appropriate to break her fast.  I instructed her to take Chloraseptic before  sunrise, and once during the day to break the fast, and then once again when the sun sets.  This is likely viral pharyngitis.  Strict return precautions given.  She is a for discharge.  We will give her a work note. ? ?Final Clinical Impression(s) / ED Diagnoses ?Final diagnoses:  ?Pharyngitis, unspecified etiology  ? ? ?Rx / DC Orders ?ED Discharge Orders   ? ? None  ? ?  ? ? ?  ?Teressa Lower, PA-C ?07/13/21 1550 ? ?  ?Sloan Leiter, DO ?07/13/21 2023 ? ?

## 2021-07-13 NOTE — ED Triage Notes (Signed)
Pt c/o sore throat, dry cough, runny nose x 3 days-NAD-steady gait ?

## 2021-07-13 NOTE — Discharge Instructions (Signed)
Please take Tylenol and Chloraseptic Spray as needed for throat pain.  You can gargle with warm salt water as well.  This is a virus and will likely resolve on its own.  Return to the emergency department any worsening symptoms you have. ?

## 2021-08-05 ENCOUNTER — Encounter (HOSPITAL_BASED_OUTPATIENT_CLINIC_OR_DEPARTMENT_OTHER): Payer: Self-pay | Admitting: Emergency Medicine

## 2021-08-05 ENCOUNTER — Emergency Department (HOSPITAL_BASED_OUTPATIENT_CLINIC_OR_DEPARTMENT_OTHER)
Admission: EM | Admit: 2021-08-05 | Discharge: 2021-08-05 | Disposition: A | Discharge status: Home / Self Care | Payer: 59 | Attending: Emergency Medicine | Admitting: Emergency Medicine

## 2021-08-05 ENCOUNTER — Other Ambulatory Visit: Payer: Self-pay

## 2021-08-05 DIAGNOSIS — M5410 Radiculopathy, site unspecified: Secondary | ICD-10-CM | POA: Insufficient documentation

## 2021-08-05 DIAGNOSIS — M25512 Pain in left shoulder: Secondary | ICD-10-CM | POA: Insufficient documentation

## 2021-08-05 DIAGNOSIS — X500XXA Overexertion from strenuous movement or load, initial encounter: Secondary | ICD-10-CM | POA: Diagnosis not present

## 2021-08-05 DIAGNOSIS — Y92210 Daycare center as the place of occurrence of the external cause: Secondary | ICD-10-CM | POA: Diagnosis not present

## 2021-08-05 DIAGNOSIS — Z21 Asymptomatic human immunodeficiency virus [HIV] infection status: Secondary | ICD-10-CM | POA: Insufficient documentation

## 2021-08-05 MED ORDER — DEXAMETHASONE SODIUM PHOSPHATE 10 MG/ML IJ SOLN
8.0000 mg | Freq: Once | INTRAMUSCULAR | Status: AC
  Administered 2021-08-05: 8 mg via INTRAMUSCULAR
  Filled 2021-08-05: qty 1

## 2021-08-05 MED ORDER — METHOCARBAMOL 500 MG PO TABS: 500.0000 mg | ORAL_TABLET | Freq: Two times a day (BID) | ORAL | 0 refills | Status: DC | PRN

## 2021-08-05 NOTE — ED Triage Notes (Addendum)
Pt c/o LT shoulder pain, worse when extending arm, x 4d; pt works in childcare  ?

## 2021-08-05 NOTE — Discharge Instructions (Addendum)
I am prescribing you a strong muscle relaxer called Robaxin.  You can take this up to 2 times a day for management of your pain.  This medication can be sedating so do not mix it with alcohol.  Do not drive a car after taking it. ? ?Below is the contact information for Dr. Jordan Likes.  He is a Designer, multimedia sports medicine physician.  His contact information is below.  Please give them a call and schedule an appointment in 1 to 2 weeks if you find your symptoms persist. ? ?Please continue to wear your shoulder sling as needed for comfort.  Please make sure that you are performing range of motion exercises with the left shoulder and not keeping it in the sling throughout the day. ? ?If you develop any new or worsening symptoms please come back to the emergency department. ?

## 2021-08-05 NOTE — ED Provider Notes (Signed)
?[facility] EMERGENCY DEPARTMENT ?Provider Note ? ? ?CSN: [id number] ?Arrival date & time: 08/05/21  1409 ? ?  ? ?History ? ?Chief Complaint  ?Patient presents with  ? Shoulder Pain  ? ? ?Kathy Reyes is a 51 y.o. female. ? ?HPI ?Patient is a 51 year old female with a history of HIV who presents to the emergency department due to left shoulder pain.  States that her symptoms started about 4 days ago.  She is left-hand dominant.  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  This worsens her symptoms.  She states that when her pain worsens that it radiates down the left arm along the radial aspect of the arm.  Reports associated tingling.  No numbness.  No other complaints. ?  ? ?Home Medications ?Prior to Admission medications   ?Medication Sig Start Date End Date Taking? Authorizing Provider  ?methocarbamol (ROBAXIN) 500 MG tablet Take 1 tablet (500 mg total) by mouth 2 (two) times daily as needed for muscle spasms. 08/05/21  Yes Placido Sou, PA-C  ?abacavir-dolutegravir-lamiVUDine (TRIUMEQ) 600-50-300 MG tablet TAKE 1 TABLET BY MOUTH EVERY DAY 01/31/19   [provider]  ?amoxicillin-clavulanate (AUGMENTIN) 875-125 MG tablet Take 1 tablet by mouth every 12 (twelve) hours. 04/17/19   Gailen Shelter, PA  ?ferrous sulfate 325 (65 FE) MG EC tablet Take by mouth. 10/02/18   [provider]  ?HYDROcodone bit-homatropine (HYCODAN) 5-1.5 MG/5ML syrup Take 5 mLs by mouth every 6 (six) hours as needed for cough. 09/25/20   Liberty Handy, PA-C  ?ibuprofen (ADVIL) 600 MG tablet Take 1 tablet (600 mg total) by mouth every 6 (six) hours as needed for moderate pain. 02/12/20   Lorelee New, PA-C  ?ondansetron (ZOFRAN ODT) 4 MG disintegrating tablet 4mg  ODT q4 hours prn nausea/vomit 03/09/21   03/11/21, MD  ?   ? ?Allergies    ?Patient has no known allergies.   ? ?Review of Systems   ?Review of Systems  ?Musculoskeletal:  Positive for  arthralgias. Negative for myalgias and neck pain.  ?Skin:  Negative for wound.  ?Neurological:  Negative for weakness and numbness.  ? ?Physical Exam ?Updated Vital Signs ?BP 118/86 (BP Location: Right Arm)   Pulse 83   Temp 98 ?F (36.7 ?C) (Oral)   Resp 16   Ht 5\' 4"  (1.626 m)   Wt 77.1 kg   SpO2 100%   BMI 29.18 kg/m?  ?Physical Exam ?Vitals and nursing note reviewed.  ?Constitutional:   ?   General: She is not in acute distress. ?   Appearance: She is well-developed.  ?HENT:  ?   Head: Normocephalic and atraumatic.  ?   Right Ear: External ear normal.  ?   Left Ear: External ear normal.  ?Eyes:  ?   General: No scleral icterus.    ?   Right eye: No discharge.     ?   Left eye: No discharge.  ?   Conjunctiva/sclera: Conjunctivae normal.  ?Neck:  ?   Trachea: No tracheal deviation.  ?Cardiovascular:  ?   Rate and Rhythm: Normal rate.  ?Pulmonary:  ?   Effort: Pulmonary effort is normal. No respiratory distress.  ?   Breath sounds: No stridor.  ?Abdominal:  ?   General: There is no distension.  ?Musculoskeletal:     ?   General: Tenderness present. No swelling or deformity.  ?   Cervical back: Neck supple.  ?  Comments: Moderate TTP noted to the left superior shoulder.  No increased warmth.  Full active and passive range of motion of the left shoulder.  Strength is 5/5 in the left arm.  Distal sensation intact.  2+ radial pulse.  Grip strength intact.  When palpating the left superior shoulder patient's pain appears to radiate down the radial aspect of the arm through the thumb. ? ?No midline C, T, or L-spine tenderness.  No step-offs, crepitus, or deformities.  ?Skin: ?   General: Skin is warm and dry.  ?   Findings: No rash.  ?Neurological:  ?   Mental Status: She is alert.  ?   Cranial Nerves: Cranial nerve deficit: no gross deficits.  ? ?ED Results / Procedures / Treatments   ?Labs ?(all labs ordered are listed, but only abnormal results are displayed) ?Labs Reviewed - No data to  display ? ?EKG ?None ? ?Radiology ?No results found. ? ?Procedures ?Procedures  ? ?Medications Ordered in ED ?Medications  ?dexamethasone (DECADRON) injection 8 mg (has no administration in time range)  ? ? ?ED Course/ Medical Decision Making/ A&P ?  ?                        ?Medical Decision Making ?Risk ?Prescription drug management. ? ?Patient is a 51 year old female who presents to the emergency department due to left shoulder pain that started about 4 days ago.  She is left-hand dominant and works in a daycare performing frequent heavy lifting. ? ?On my exam patient has moderate tenderness along the left superior shoulder.  When palpating the region her pain radiates down the radial aspect of the arm.  Appears to be consistent with the C6 dermatome.  Patient does not have any midline spine tenderness on my exam.  She is neurovascularly intact in the left arm with no gross strength deficits noted.  Did not feel that imaging was warranted at this visit and she is agreeable.  We will give patient a single dose of IM Decadron here in the emergency department.  Will discharge on a course of Robaxin.  We discussed safety regarding this medication. ? ?Patient is appear stable for discharge at this time and she is agreeable.  Will place in a sling for comfort.  Patient given a referral to sports medicine if she finds that her symptoms are refractory.  Discussed return precautions.  Her questions were answered and she was amicable at the time of discharge. ?Final Clinical Impression(s) / ED Diagnoses ?Final diagnoses:  ?Acute pain of left shoulder  ?Radiculopathy, unspecified spinal region  ? ?Rx / DC Orders ?ED Discharge Orders   ? ?      Ordered  ?  methocarbamol (ROBAXIN) 500 MG tablet  2 times daily PRN       ? 08/05/21 1534  ? ?  ?  ? ?  ? ? ?  ?Placido Sou, PA-C ?08/05/21 1541 ? ?  ?Charlynne Pander, MD ?08/05/21 2248 ? ?

## 2021-08-21 ENCOUNTER — Encounter: Payer: Self-pay | Admitting: Family

## 2022-07-09 ENCOUNTER — Other Ambulatory Visit: Payer: Self-pay

## 2022-07-09 ENCOUNTER — Encounter (HOSPITAL_BASED_OUTPATIENT_CLINIC_OR_DEPARTMENT_OTHER): Payer: Self-pay | Admitting: Emergency Medicine

## 2022-07-09 ENCOUNTER — Emergency Department (HOSPITAL_BASED_OUTPATIENT_CLINIC_OR_DEPARTMENT_OTHER)
Admission: EM | Admit: 2022-07-09 | Discharge: 2022-07-09 | Disposition: A | Discharge status: Home / Self Care | Payer: Commercial Managed Care - HMO | Attending: Emergency Medicine | Admitting: Emergency Medicine

## 2022-07-09 ENCOUNTER — Emergency Department (HOSPITAL_BASED_OUTPATIENT_CLINIC_OR_DEPARTMENT_OTHER): Payer: Commercial Managed Care - HMO

## 2022-07-09 DIAGNOSIS — B20 Human immunodeficiency virus [HIV] disease: Secondary | ICD-10-CM | POA: Insufficient documentation

## 2022-07-09 DIAGNOSIS — R1013 Epigastric pain: Secondary | ICD-10-CM | POA: Diagnosis present

## 2022-07-09 DIAGNOSIS — K219 Gastro-esophageal reflux disease without esophagitis: Secondary | ICD-10-CM | POA: Insufficient documentation

## 2022-07-09 DIAGNOSIS — R739 Hyperglycemia, unspecified: Secondary | ICD-10-CM | POA: Insufficient documentation

## 2022-07-09 LAB — URINALYSIS, ROUTINE W REFLEX MICROSCOPIC
Bilirubin Urine: NEGATIVE
Glucose, UA: >=500 mg/dL — AB
Hgb urine dipstick: NEGATIVE
Ketones, ur: NEGATIVE mg/dL
Leukocytes,Ua: NEGATIVE
Nitrite: NEGATIVE
Protein, ur: NEGATIVE mg/dL
Specific Gravity, Urine: 1.015 (ref 1.005–1.030)
pH: 7 (ref 5.0–8.0)

## 2022-07-09 LAB — COMPREHENSIVE METABOLIC PANEL
ALT: 23 U/L (ref 0–44)
AST: 21 U/L (ref 15–41)
Albumin: 3.3 g/dL — ABNORMAL LOW (ref 3.5–5.0)
Alkaline Phosphatase: 72 U/L (ref 38–126)
Anion gap: 7 (ref 5–15)
BUN: <5 mg/dL — ABNORMAL LOW (ref 6–20)
CO2: 26 mmol/L (ref 22–32)
Calcium: 8.4 mg/dL — ABNORMAL LOW (ref 8.9–10.3)
Chloride: 96 mmol/L — ABNORMAL LOW (ref 98–111)
Creatinine, Ser: 0.65 mg/dL (ref 0.44–1.00)
GFR, Estimated: >60 mL/min (ref >60)
Glucose, Bld: 417 mg/dL — ABNORMAL HIGH (ref 70–99)
Potassium: 3.7 mmol/L (ref 3.5–5.1)
Sodium: 129 mmol/L — ABNORMAL LOW (ref 135–145)
Total Bilirubin: 0.4 mg/dL (ref 0.3–1.2)
Total Protein: 7.6 g/dL (ref 6.5–8.1)

## 2022-07-09 LAB — URINALYSIS, MICROSCOPIC (REFLEX)

## 2022-07-09 LAB — CBG MONITORING, ED: Glucose-Capillary: 265 mg/dL — ABNORMAL HIGH (ref 70–99)

## 2022-07-09 LAB — CBC
HCT: 37.3 % (ref 36.0–46.0)
Hemoglobin: 12 g/dL (ref 12.0–15.0)
MCH: 29 pg (ref 26.0–34.0)
MCHC: 32.2 g/dL (ref 30.0–36.0)
MCV: 90.1 fL (ref 80.0–100.0)
Platelets: 249 10*3/uL (ref 150–400)
RBC: 4.14 MIL/uL (ref 3.87–5.11)
RDW: 16.6 % — ABNORMAL HIGH (ref 11.5–15.5)
WBC: 3.8 10*3/uL — ABNORMAL LOW (ref 4.0–10.5)
nRBC: 0 % (ref 0.0–0.2)

## 2022-07-09 LAB — TROPONIN I (HIGH SENSITIVITY): Troponin I (High Sensitivity): <2 ng/L

## 2022-07-09 LAB — LIPASE, BLOOD: Lipase: 22 U/L (ref 11–51)

## 2022-07-09 MED ORDER — PANTOPRAZOLE SODIUM 40 MG PO TBEC: 40.0000 mg | DELAYED_RELEASE_TABLET | Freq: Every day | ORAL | 0 refills | Status: DC

## 2022-07-09 MED ORDER — SODIUM CHLORIDE 0.9 % IV BOLUS
1000.0000 mL | Freq: Once | INTRAVENOUS | Status: AC
  Administered 2022-07-09: 1000 mL via INTRAVENOUS

## 2022-07-09 MED ORDER — ALUM & MAG HYDROXIDE-SIMETH 200-200-20 MG/5ML PO SUSP
30.0000 mL | Freq: Once | ORAL | Status: AC
  Administered 2022-07-09: 30 mL via ORAL
  Filled 2022-07-09: qty 30

## 2022-07-09 MED ORDER — LIDOCAINE VISCOUS HCL 2 % MT SOLN
15.0000 mL | Freq: Once | OROMUCOSAL | Status: AC
  Administered 2022-07-09: 15 mL via ORAL
  Filled 2022-07-09: qty 15

## 2022-07-09 MED ORDER — METFORMIN HCL 500 MG PO TABS: 500.0000 mg | ORAL_TABLET | Freq: Two times a day (BID) | ORAL | 0 refills | Status: AC

## 2022-07-09 MED ORDER — ONDANSETRON HCL 4 MG/2ML IJ SOLN
4.0000 mg | Freq: Once | INTRAMUSCULAR | Status: AC
  Administered 2022-07-09: 4 mg via INTRAVENOUS
  Filled 2022-07-09: qty 2

## 2022-07-09 NOTE — Discharge Instructions (Addendum)
Please follow-up as soon as you are able to with your primary care doctor to discuss your elevated blood sugar, and recheck to see if your symptoms of acid reflux, generalized body pain are improving.  Please return the emergency department if your symptoms worsen despite treatment or you have significantly elevated blood sugar before you are able to follow-up with your PCP.

## 2022-07-09 NOTE — ED Notes (Signed)
ED Provider at bedside. 

## 2022-07-09 NOTE — ED Provider Notes (Signed)
[facility] Provider Note   CSN: [id number] Arrival date & time: 07/09/22  1133     History  Chief Complaint  Patient presents with   Abdominal Pain   Emesis    Kathy Reyes is a 52 y.o. female with past medical history significant for HIV, anemia, GERD who presents with concern for epigastric abdominal pain, nausea, vomiting, acid taste after meals, with some occasional cough, shortness of breath with exertion.  She reports some chest pain only with cough, denies chest pain with exertion.  She denies any fever, chills.  Patient reports that she has been taking her antiretroviral therapy without missing any doses.  She has tried some over-the-counter Tylenol and Tums without significant relief.   Abdominal Pain Associated symptoms: vomiting   Emesis Associated symptoms: abdominal pain        Home Medications Prior to Admission medications   Medication Sig Start Date End Date Taking? Authorizing Provider  metFORMIN (GLUCOPHAGE) 500 MG tablet Take 1 tablet (500 mg total) by mouth 2 (two) times daily with a meal. 07/09/22  Yes Kaylen Nghiem H, PA-C  pantoprazole (PROTONIX) 40 MG tablet Take 1 tablet (40 mg total) by mouth daily. 07/09/22  Yes Deago Burruss H, PA-C  abacavir-dolutegravir-lamiVUDine (TRIUMEQ) 600-50-300 MG tablet TAKE 1 TABLET BY MOUTH EVERY DAY 01/31/19   [provider]  amoxicillin-clavulanate (AUGMENTIN) 875-125 MG tablet Take 1 tablet by mouth every 12 (twelve) hours. 04/17/19   Tedd Sias, PA  ferrous sulfate 325 (65 FE) MG EC tablet Take by mouth. 10/02/18   [provider]  HYDROcodone bit-homatropine (HYCODAN) 5-1.5 MG/5ML syrup Take 5 mLs by mouth every 6 (six) hours as needed for cough. 09/25/20   Kinnie Feil, PA-C  ibuprofen (ADVIL) 600 MG tablet Take 1 tablet (600 mg total) by mouth every 6 (six) hours as needed for moderate pain. 02/12/20   Corena Herter, PA-C   methocarbamol (ROBAXIN) 500 MG tablet Take 1 tablet (500 mg total) by mouth 2 (two) times daily as needed for muscle spasms. 08/05/21   Rayna Sexton, PA-C  ondansetron (ZOFRAN ODT) 4 MG disintegrating tablet 4mg  ODT q4 hours prn nausea/vomit 03/09/21   Drenda Freeze, MD      Allergies    Patient has no known allergies.    Review of Systems   Review of Systems  Gastrointestinal:  Positive for abdominal pain and vomiting.  All other systems reviewed and are negative.   Physical Exam Updated Vital Signs BP 133/86   Pulse 81   Temp (!) 97.4 F (36.3 C)   Resp (!) 21   Wt 80 kg   LMP 03/19/2019   SpO2 100%   BMI 30.27 kg/m  Physical Exam Vitals and nursing note reviewed.  Constitutional:      General: She is not in acute distress.    Appearance: Normal appearance.  HENT:     Head: Normocephalic and atraumatic.  Eyes:     General:        Right eye: No discharge.        Left eye: No discharge.  Cardiovascular:     Rate and Rhythm: Normal rate and regular rhythm.     Heart sounds: No murmur heard.    No friction rub. No gallop.  Pulmonary:     Effort: Pulmonary effort is normal.     Breath sounds: Normal breath sounds.     Comments: No accessory breath sounds, wheezing, rhonchi,  stridor, rales Abdominal:     General: Bowel sounds are normal.     Palpations: Abdomen is soft.     Comments: Tenderness in the epigastric region, no rebound, rigidity, guarding  Skin:    General: Skin is warm and dry.     Capillary Refill: Capillary refill takes less than 2 seconds.  Neurological:     Mental Status: She is alert and oriented to person, place, and time.  Psychiatric:        Mood and Affect: Mood normal.        Behavior: Behavior normal.     ED Results / Procedures / Treatments   Labs (all labs ordered are listed, but only abnormal results are displayed) Labs Reviewed  CBC - Abnormal; Notable for the following components:      Result Value   WBC 3.8 (*)     RDW 16.6 (*)    All other components within normal limits  COMPREHENSIVE METABOLIC PANEL - Abnormal; Notable for the following components:   Sodium 129 (*)    Chloride 96 (*)    Glucose, Bld 417 (*)    BUN <5 (*)    Calcium 8.4 (*)    Albumin 3.3 (*)    All other components within normal limits  URINALYSIS, ROUTINE W REFLEX MICROSCOPIC - Abnormal; Notable for the following components:   Glucose, UA >=500 (*)    All other components within normal limits  URINALYSIS, MICROSCOPIC (REFLEX) - Abnormal; Notable for the following components:   Bacteria, UA RARE (*)    All other components within normal limits  LIPASE, BLOOD  TROPONIN I (HIGH SENSITIVITY)    EKG None  Radiology DG Chest 2 View  Result Date: 07/09/2022 CLINICAL DATA:  Sob EXAM: CHEST - 2 VIEW COMPARISON:  03/09/2021 FINDINGS: Cardiac silhouette is unremarkable. No pneumothorax or pleural effusion. The lungs are clear. The visualized skeletal structures are unremarkable. IMPRESSION: No acute cardiopulmonary process. Electronically Signed   By: Sammie Bench M.D.   On: 07/09/2022 12:04    Procedures Procedures    Medications Ordered in ED Medications  alum & mag hydroxide-simeth (MAALOX/MYLANTA) 200-200-20 MG/5ML suspension 30 mL (30 mLs Oral Given 07/09/22 1201)    And  lidocaine (XYLOCAINE) 2 % viscous mouth solution 15 mL (15 mLs Oral Given 07/09/22 1201)  ondansetron (ZOFRAN) injection 4 mg (4 mg Intravenous Given 07/09/22 1209)  sodium chloride 0.9 % bolus 1,000 mL (1,000 mLs Intravenous New Bag/Given 07/09/22 1322)    ED Course/ Medical Decision Making/ A&P                              Medical Decision Making  This patient is a 52 y.o. female  who presents to the ED for concern of epigastric abdominal pain, weakness, shortness of breath with exertion, intermittent dizziness.   Differential diagnoses prior to evaluation: The emergent differential diagnosis includes, but is not limited to,  esophagitis,  gastritis, peptic ulcer disease, esophageal rupture, gastric rupture, Boerhaave's, Mallory-Weiss, pancreatitis, cholecystitis, cholangitis, acute mesenteric ischemia, atypical chest pain or ACS, lower lobar pneumonia, CVA, spinal cord injury, ACS, arrhythmia, syncope, orthostatic hypotension, sepsis, hypoglycemia, hypoxia, electrolyte disturbance, endocrine disorder, anemia, environmental exposure, polypharmacy . This is not an exhaustive differential.   Past Medical History / Co-morbidities: HIV, she is taking HAART therapy, she reports compliance, history of GERD, family history of diabetes but no personal history of diabetes  Additional history: Chart reviewed. Pertinent results  include: Reviewed lab work, imaging from previous emergency department visits  Physical Exam: Physical exam performed. The pertinent findings include: Some tenderness palpation in the epigastric region, no rebound, rigidity, guarding  Lab Tests/Imaging studies: I personally interpreted labs/imaging and the pertinent results include:  CBC overall unremarkable, troponin negative x 1 in context of exertional shortness of breath, no chest pain.  Her UA has rare bacteria but no ketones, protein, nitrates, leukocytes, without dysuria I do not think that she has an acute bacterial infection.  She does have glucosuria, CMP notable for glucose of 417.  Her overall dizziness, weakness, shortness of breath is likely because of new onset diabetes.  Patient endorses family history of diabetes, she has never had a formal diagnosis of diabetes in the past.  She is not currently taking any medication for blood sugar.  She denies any dietary changes.  She reports that she did not have anything to eat before presenting to the emergency department today..  I independently interpreted plain film chest x-ray which shows no acute intrathoracic abnormality.  I agree with the radiologist interpretation.  Cardiac monitoring: EKG obtained and  interpreted by my attending physician which shows: Normal sinus rhythm   Medications: I ordered medication including fluid bolus, GI cocktail, Zofran for nausea, hyperglycemia, clinical dehydration.  I have reviewed the patients home medicines and have made adjustments as needed.   Disposition: After consideration of the diagnostic results and the patients response to treatment, I feel that patient symptoms are consistent with new onset diabetes, as well as acid reflux, encouraged Protonix, Maalox, Mylanta for acid reflux, discussed dietary changes, she will need PCP follow-up for her new hyperglycemia for A1c testing, and discussion of treatment, I will discharge her on metformin 500 mg twice daily to start.   emergency department workup does not suggest an emergent condition requiring admission or immediate intervention beyond what has been performed at this time. The plan is: As above. The patient is safe for discharge and has been instructed to return immediately for worsening symptoms, change in symptoms or any other concerns.  Final Clinical Impression(s) / ED Diagnoses Final diagnoses:  Gastroesophageal reflux disease, unspecified whether esophagitis present  Hyperglycemia    Rx / DC Orders ED Discharge Orders          Ordered    metFORMIN (GLUCOPHAGE) 500 MG tablet  2 times daily with meals        07/09/22 1425    pantoprazole (PROTONIX) 40 MG tablet  Daily        07/09/22 1425              Shawnn Bouillon, [place] H, PA-C 07/09/22 1426    Tretha Sciara, MD 07/09/22 1517

## 2022-07-09 NOTE — ED Triage Notes (Addendum)
Reports abdominal pian , emesis , acid taste she said only post meals , Hx GERD Also reports cough x 3 weeks , shortness of breath with exertion .  No relief with OTC meds .

## 2022-07-09 NOTE — ED Notes (Signed)
Pt discharged to home. Discharge instructions have been discussed with patient and/or family members. Pt verbally acknowledges understanding d/c instructions, and endorses comprehension to checkout at registration before leaving.  °

## 2022-07-23 ENCOUNTER — Encounter: Payer: Self-pay | Admitting: Family Medicine

## 2022-07-23 ENCOUNTER — Ambulatory Visit (INDEPENDENT_AMBULATORY_CARE_PROVIDER_SITE_OTHER): Payer: Medicaid Other | Admitting: Family Medicine

## 2022-07-23 VITALS — BP 116/74 | HR 82 | Ht 64.0 in | Wt 177.6 lb

## 2022-07-23 DIAGNOSIS — E119 Type 2 diabetes mellitus without complications: Secondary | ICD-10-CM | POA: Diagnosis present

## 2022-07-23 DIAGNOSIS — E1169 Type 2 diabetes mellitus with other specified complication: Secondary | ICD-10-CM | POA: Diagnosis not present

## 2022-07-23 DIAGNOSIS — Z7689 Persons encountering health services in other specified circumstances: Secondary | ICD-10-CM | POA: Diagnosis not present

## 2022-07-23 DIAGNOSIS — E785 Hyperlipidemia, unspecified: Secondary | ICD-10-CM

## 2022-07-23 DIAGNOSIS — R110 Nausea: Secondary | ICD-10-CM

## 2022-07-23 MED ORDER — ATORVASTATIN CALCIUM 20 MG PO TABS: 20.0000 mg | ORAL_TABLET | Freq: Every day | ORAL | 3 refills | Status: AC

## 2022-07-23 NOTE — Patient Instructions (Addendum)
It was great to meet you!  Things we discussed today: -You have diabetes. Continue your Metformin. -We are also going to start a new medication called atorvastatin (this is for cholesterol and to help protect you from heart disease/stroke) -Our pharmacy team will reach out to you about possibly getting a continuous glucose monitor. -Continue your Protonix. -See me back in 2 weeks for an annual physical (to address pap smear, mammogram, colonoscopy, etc). -See me back in 1 month for abdominal pain/nausea/diabetes follow up  Take care, Dr Rock Nephew

## 2022-07-23 NOTE — Progress Notes (Unsigned)
   Subjective:    Patient ID: Kathy Reyes, female    DOB: Dec 22, 1970, 52 y.o.   MRN: [id number]  CC: Establish care  HPI:  Kathy Reyes is a very pleasant 52 y.o. female who presents today to establish care. Has not had a PCP for many years.  Initial concerns:  Recently told she has diabetes. However she does not think she does. Nausea: spent the last 9 months or so in [place]. Someone there said it could maybe be a gallbladder issue  Past medical history: Diabetes-- very recently diagnosed. HIV-- follows with ID. Was off meds while in [place] but recently resumed. 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 normal heart sounds, no murmurs Respiratory: CTAB, normal effort, No wheezes, rales or rhonchi Abdomen: soft, non-tender, non-distended Extremities: no edema or cyanosis Skin: warm and dry, no rashes noted Neuro: alert, no obvious focal deficits Psych: Normal affect and mood   Assessment & Plan:   Type 2 diabetes mellitus without complication, without long-term current use of insulin New diagnosis. A1c 9.3% on 07/15/22. Discussed diabetes in-detail with patient including natural disease course, complications, recommended management  and screening items. -Continue Metformin 500mg  BID -Start Atorvastatin 20mg  daily -Will send to pharmacy team for LIBERATE study -F/u in 1 month. Will do foot exam and check urine microalbumin at that time -Consider ARB and GLP-1 at follow-up -Next A1c in 3 months  Nausea May be related to uncontrolled diabetes. Address diabetes as above. Continue protonix 40mg  daily for possible GERD component. Follow up in 1 month.   Return in 2 weeks for annual physical (overdue for many health maintenance items)   Alcus Dad, MD [facility] PGY-3

## 2022-07-25 ENCOUNTER — Telehealth: Payer: Self-pay

## 2022-07-25 DIAGNOSIS — E119 Type 2 diabetes mellitus without complications: Secondary | ICD-10-CM | POA: Insufficient documentation

## 2022-07-25 DIAGNOSIS — E1169 Type 2 diabetes mellitus with other specified complication: Secondary | ICD-10-CM | POA: Insufficient documentation

## 2022-07-25 NOTE — Addendum Note (Signed)
Addended by: Owens Shark, Diron Haddon on: 07/25/2022 01:23 PM   Modules accepted: Level of Service

## 2022-07-25 NOTE — Telephone Encounter (Signed)
Attempted to contact patient regarding LIBERATE study enrollment. Unable to leave message.   Joseph Art, Pharm.D. PGY-2 Ambulatory Care Pharmacy Resident

## 2022-07-25 NOTE — Assessment & Plan Note (Addendum)
New diagnosis. A1c 9.3% on 07/15/22. Discussed diabetes in-detail with patient including natural disease course, complications, recommended management and screening items. -Continue Metformin 500mg  BID -Start Atorvastatin 20mg  daily -Will send to pharmacy team for LIBERATE study -F/u in 1 month. Will do foot exam and check urine microalbumin at that time -Consider ARB and GLP-1 at follow-up -Next A1c in 3 months

## 2022-08-06 ENCOUNTER — Ambulatory Visit: Payer: Commercial Managed Care - HMO | Admitting: Family Medicine

## 2022-08-06 NOTE — Progress Notes (Deleted)
    SUBJECTIVE:   Chief compliant/HPI: annual examination  Chrishawn I Corti is a 52 y.o. who presents today for an annual exam.    History tabs reviewed and updated ***.   Review of systems form reviewed and notable for ***.   OBJECTIVE:   LMP 03/19/2019   ***  ASSESSMENT/PLAN:   No problem-specific Assessment & Plan notes found for this encounter.    Annual Examination  See AVS for age appropriate recommendations  PHQ score ***, reviewed and discussed.  BP reviewed and at goal ***.  Asked about intimate partner violence and resources given as appropriate  Advance directives discussion ***  Considered the following items based upon USPSTF recommendations: Diabetes screening: {discussed/ordered:14545} Screening for elevated cholesterol: {discussed/ordered:14545} HIV testing: {discussed/ordered:14545} Hepatitis C: {discussed/ordered:14545} Hepatitis B: {discussed/ordered:14545} Syphilis if at high risk: {discussed/ordered:14545} GC/CT {GC/CT screening :23818} Osteoporosis screening considered based upon risk of fracture from [facility] calculator. Major osteoporotic fracture risk is ***%. DEXA {ordered not order:23822}.  Reviewed risk factors for latent tuberculosis and {not indicated/requested/declined:14582}   Discussed family history, BRCA testing {not indicated/requested/declined:14582}. Tool used to risk stratify was ***.  Cervical cancer screening: {PAPTYPE:23819} Breast cancer screening: {mammoscreen:23820} Colorectal cancer screening: {crcscreen:23821::"discussed, colonoscopy ordered"} Lung cancer screening: {discussed/declined/written info:19698}. See documentation below regarding indications/risks/benefits.  Vaccinations ***.   Follow up in 1 *** year or sooner if indicated.    Maury Dus, MD [facility]

## 2023-03-29 ENCOUNTER — Emergency Department (HOSPITAL_BASED_OUTPATIENT_CLINIC_OR_DEPARTMENT_OTHER)
Admission: EM | Admit: 2023-03-29 | Discharge: 2023-03-29 | Disposition: A | Discharge status: Home / Self Care | Payer: Medicaid Other | Attending: Emergency Medicine | Admitting: Emergency Medicine

## 2023-03-29 ENCOUNTER — Other Ambulatory Visit: Payer: Self-pay

## 2023-03-29 ENCOUNTER — Encounter (HOSPITAL_BASED_OUTPATIENT_CLINIC_OR_DEPARTMENT_OTHER): Payer: Self-pay

## 2023-03-29 ENCOUNTER — Emergency Department (HOSPITAL_BASED_OUTPATIENT_CLINIC_OR_DEPARTMENT_OTHER): Payer: Medicaid Other

## 2023-03-29 DIAGNOSIS — Y9301 Activity, walking, marching and hiking: Secondary | ICD-10-CM | POA: Insufficient documentation

## 2023-03-29 DIAGNOSIS — X501XXA Overexertion from prolonged static or awkward postures, initial encounter: Secondary | ICD-10-CM | POA: Diagnosis not present

## 2023-03-29 DIAGNOSIS — M25571 Pain in right ankle and joints of right foot: Secondary | ICD-10-CM | POA: Diagnosis present

## 2023-03-29 DIAGNOSIS — S93401A Sprain of unspecified ligament of right ankle, initial encounter: Secondary | ICD-10-CM | POA: Insufficient documentation

## 2023-03-29 NOTE — ED Provider Notes (Signed)
[facility] Provider Note   CSN: [id number] Arrival date & time: 03/29/23  1426     History Chief Complaint  Patient presents with   Ankle Pain    Kathy Reyes is a 52 y.o. female.  Patient presents to the emergency department concerns of ankle pain.  She reports that she twisted her ankle she was walking with eversion of the right ankle.  States that she has had some notable swelling and not having difficulty bearing weight.  She denies any loss of function although movement is painful.  Sensation intact.  Has not take any medications prior to arriving.  Denies fully falling and no other pain in any other area.   Ankle Pain      Home Medications Prior to Admission medications   Medication Sig Start Date End Date Taking? Authorizing Provider  abacavir-dolutegravir-lamiVUDine (TRIUMEQ) 600-50-300 MG tablet TAKE 1 TABLET BY MOUTH EVERY DAY 01/31/19   [provider]  atorvastatin (LIPITOR) 20 MG tablet Take 1 tablet (20 mg total) by mouth daily. 07/23/22   Maury Dus, MD  ferrous sulfate 325 (65 FE) MG EC tablet Take by mouth. 10/02/18   [provider]  ibuprofen (ADVIL) 600 MG tablet Take 1 tablet (600 mg total) by mouth every 6 (six) hours as needed for moderate pain. 02/12/20   Lorelee New, PA-C  metFORMIN (GLUCOPHAGE) 500 MG tablet Take 1 tablet (500 mg total) by mouth 2 (two) times daily with a meal. 07/09/22   Prosperi, Christian H, PA-C  pantoprazole (PROTONIX) 40 MG tablet Take 1 tablet (40 mg total) by mouth daily. 07/09/22   Prosperi, Christian H, PA-C      Allergies    Patient has no known allergies.    Review of Systems   Review of Systems  Musculoskeletal:        Ankle pain  All other systems reviewed and are negative.   Physical Exam Updated Vital Signs BP 132/79 (BP Location: Right Arm)   Pulse 72   Temp 97.8 F (36.6 C) (Oral)   Resp 20   Ht 5\' 4"  (1.626 m)   Wt 80 kg   LMP  03/19/2019   SpO2 100%   BMI 30.27 kg/m  Physical Exam Vitals and nursing note reviewed.  Constitutional:      General: She is not in acute distress.    Appearance: She is well-developed.  HENT:     Head: Normocephalic and atraumatic.  Eyes:     Conjunctiva/sclera: Conjunctivae normal.  Cardiovascular:     Rate and Rhythm: Normal rate and regular rhythm.     Heart sounds: No murmur heard. Pulmonary:     Effort: Pulmonary effort is normal. No respiratory distress.     Breath sounds: Normal breath sounds.  Abdominal:     Palpations: Abdomen is soft.     Tenderness: There is no abdominal tenderness.  Musculoskeletal:        General: Swelling, tenderness and signs of injury present. No deformity.     Cervical back: Neck supple.     Comments: Limited ROM of the right ankle due to pain.  There is swelling and tenderness along the lateral aspect of the right ankle.  Minimal swelling to the medial aspect.  No bruising seen.  Strength testing is difficult due to pain although patient does have movement in all directions.  Neurovascular intact.  Skin:    General: Skin is warm and dry.  Capillary Refill: Capillary refill takes less than 2 seconds.  Neurological:     Mental Status: She is alert.  Psychiatric:        Mood and Affect: Mood normal.     ED Results / Procedures / Treatments   Labs (all labs ordered are listed, but only abnormal results are displayed) Labs Reviewed - No data to display  EKG None  Radiology DG Ankle Complete Right  Result Date: 03/29/2023 CLINICAL DATA:  Twisting injury today, anterior pain EXAM: RIGHT ANKLE - COMPLETE 3+ VIEW COMPARISON:  None Available. FINDINGS: Frontal, oblique, and lateral views of the right ankle are obtained. No acute displaced fracture, subluxation, or dislocation. Joint spaces are well preserved. Soft tissues are unremarkable. IMPRESSION: 1. Unremarkable right ankle. Electronically Signed   By: Sharlet Salina M.D.   On:  03/29/2023 15:17    Procedures Procedures   Medications Ordered in ED Medications - No data to display  ED Course/ Medical Decision Making/ A&P                               Medical Decision Making Amount and/or Complexity of Data Reviewed Radiology: ordered.   This patient presents to the ED for concern of ankle pain.  Differential diagnosis includes ankle sprain, ankle fracture, ankle dislocation, tibial fracture   Imaging Studies ordered:  I ordered imaging studies including x-ray right ankle I independently visualized and interpreted imaging which showed no obvious acute fracture dislocations I agree with the radiologist interpretation   Problem List / ED Course:  Patient presents to the emergency department concerns of right ankle pain.  She reports that she twisted her ankle she was walking with eversion of the ankle.  States that she is having difficulty bearing weight due to pain.  Some swelling noted to the lateral aspect of the right ankle.  No significant bruising.  Motor function intact and sensation at baseline. X-ray imaging obtained for assessment of the right ankle. No obvious fracture or dislocation seen. Exam does reveal notable swelling and tenderness to the lateral aspect of the right ankle. No obvious deformity and no midfoot pain to palpation. Exam is reassuring but I do suspect an ankle sprain given mechanism and level of swelling. No significant malleolar tenderness. Will place patient into ASO lace-up ankle brace and provide crutches for added support. Encouraged patient to follow up with PCP and possibly orthopedics for further management and evaluation of her ankle discomfort if failing to improve after 1-2 weeks. Strict return precautions discussed. Patient discharged home in stable condition.  Final Clinical Impression(s) / ED Diagnoses Final diagnoses:  Sprain of right ankle, unspecified ligament, initial encounter    Rx / DC Orders ED Discharge  Orders     None         Smitty Knudsen, PA-C 03/29/23 1634    Franne Forts, DO 04/07/23 1432

## 2023-03-29 NOTE — ED Triage Notes (Signed)
The patient twisted her right ankle today. No other injury.

## 2023-03-29 NOTE — Discharge Instructions (Signed)
You are seen in the emergency department today with concerns of ankle pain.  Your x-ray imaging is negative for any acute fractures but I suspect you likely have a sprain of the right ankle.  You are placed into an ankle brace and provided with crutches for added mobility.  I would recommend following up with your primary care provider for further evaluation.  Return to the emergency department if you have any worsening symptoms.  You may otherwise take Tylenol ibuprofen as needed for pain.

## 2023-03-29 NOTE — ED Notes (Signed)
PT. Could not use crutches at all, requested walker instead, pt did much better with ambulation.

## 2024-02-13 ENCOUNTER — Emergency Department (HOSPITAL_BASED_OUTPATIENT_CLINIC_OR_DEPARTMENT_OTHER): Payer: Self-pay

## 2024-02-13 ENCOUNTER — Encounter (HOSPITAL_BASED_OUTPATIENT_CLINIC_OR_DEPARTMENT_OTHER): Payer: Self-pay

## 2024-02-13 ENCOUNTER — Emergency Department (HOSPITAL_BASED_OUTPATIENT_CLINIC_OR_DEPARTMENT_OTHER)
Admission: EM | Admit: 2024-02-13 | Discharge: 2024-02-13 | Disposition: A | Discharge status: Home / Self Care | Payer: Self-pay | Attending: Emergency Medicine | Admitting: Emergency Medicine

## 2024-02-13 ENCOUNTER — Other Ambulatory Visit: Payer: Self-pay

## 2024-02-13 DIAGNOSIS — Z7984 Long term (current) use of oral hypoglycemic drugs: Secondary | ICD-10-CM | POA: Insufficient documentation

## 2024-02-13 DIAGNOSIS — K8020 Calculus of gallbladder without cholecystitis without obstruction: Secondary | ICD-10-CM | POA: Insufficient documentation

## 2024-02-13 DIAGNOSIS — R1013 Epigastric pain: Secondary | ICD-10-CM

## 2024-02-13 DIAGNOSIS — Z21 Asymptomatic human immunodeficiency virus [HIV] infection status: Secondary | ICD-10-CM | POA: Insufficient documentation

## 2024-02-13 DIAGNOSIS — E119 Type 2 diabetes mellitus without complications: Secondary | ICD-10-CM | POA: Insufficient documentation

## 2024-02-13 HISTORY — DX: Gastro-esophageal reflux disease without esophagitis: K21.9

## 2024-02-13 LAB — COMPREHENSIVE METABOLIC PANEL WITH GFR
ALT: 11 U/L (ref 0–44)
AST: 21 U/L (ref 15–41)
Albumin: 4.1 g/dL (ref 3.5–5.0)
Alkaline Phosphatase: 105 U/L (ref 38–126)
Anion gap: 11 (ref 5–15)
BUN: 7 mg/dL (ref 6–20)
CO2: 23 mmol/L (ref 22–32)
Calcium: 9.1 mg/dL (ref 8.9–10.3)
Chloride: 105 mmol/L (ref 98–111)
Creatinine, Ser: 0.58 mg/dL (ref 0.44–1.00)
GFR, Estimated: >60 mL/min (ref >60)
Glucose, Bld: 189 mg/dL — ABNORMAL HIGH (ref 70–99)
Potassium: 3.8 mmol/L (ref 3.5–5.1)
Sodium: 138 mmol/L (ref 135–145)
Total Bilirubin: 0.2 mg/dL (ref 0.0–1.2)
Total Protein: 7.3 g/dL (ref 6.5–8.1)

## 2024-02-13 LAB — CBC
HCT: 35.9 % — ABNORMAL LOW (ref 36.0–46.0)
Hemoglobin: 11.4 g/dL — ABNORMAL LOW (ref 12.0–15.0)
MCH: 26.1 pg (ref 26.0–34.0)
MCHC: 31.8 g/dL (ref 30.0–36.0)
MCV: 82.3 fL (ref 80.0–100.0)
Platelets: 243 K/uL (ref 150–400)
RBC: 4.36 MIL/uL (ref 3.87–5.11)
RDW: 15.9 % — ABNORMAL HIGH (ref 11.5–15.5)
WBC: 6 K/uL (ref 4.0–10.5)
nRBC: 0 % (ref 0.0–0.2)

## 2024-02-13 LAB — LIPASE, BLOOD: Lipase: 13 U/L (ref 11–51)

## 2024-02-13 LAB — TROPONIN T, HIGH SENSITIVITY: Troponin T High Sensitivity: <15 ng/L (ref 0–19)

## 2024-02-13 MED ORDER — FENTANYL CITRATE (PF) 50 MCG/ML IJ SOSY
50.0000 ug | PREFILLED_SYRINGE | Freq: Once | INTRAMUSCULAR | Status: AC
  Administered 2024-02-13: 50 ug via INTRAVENOUS
  Filled 2024-02-13: qty 1

## 2024-02-13 MED ORDER — ONDANSETRON HCL 4 MG/2ML IJ SOLN
4.0000 mg | Freq: Once | INTRAMUSCULAR | Status: AC
  Administered 2024-02-13: 4 mg via INTRAVENOUS
  Filled 2024-02-13: qty 2

## 2024-02-13 MED ORDER — PANTOPRAZOLE SODIUM 40 MG PO TBEC: 40.0000 mg | DELAYED_RELEASE_TABLET | Freq: Every day | ORAL | 0 refills | Status: AC

## 2024-02-13 NOTE — ED Notes (Signed)
 Reviewed discharge instructions, follow up and medication. Questions answered. Pt states understanding. Ambulatory at discharge with family

## 2024-02-13 NOTE — ED Provider Notes (Signed)
 [place] [facility] AT [facility] Provider Note   CSN: [id number] Arrival date & time: 02/13/24  9252     Patient presents with: Abdominal Pain   Kathy Reyes is a 53 y.o. female.    Abdominal Pain Patient presents upper abdominal pain.  Started this morning or maybe last night.  Nausea.  Goes from epigastric to right upper quadrant left upper quadrant.  Goes to the back.  No fevers.  Has had a history of GERD but reviewing records also has history of gallstones.  No cholecystectomy.    Past Medical History:  Diagnosis Date   Anemia    Anemia    Blood transfusion without reported diagnosis    Diabetes (HCC)    GERD (gastroesophageal reflux disease)    HIV disease (HCC)    Past Surgical History:  Procedure Laterality Date   CESAREAN SECTION      Prior to Admission medications   Medication Sig Start Date End Date Taking? Authorizing Provider  abacavir-dolutegravir-lamiVUDine (TRIUMEQ) 600-50-300 MG tablet TAKE 1 TABLET BY MOUTH EVERY DAY 01/31/19   [provider]  atorvastatin  (LIPITOR) 20 MG tablet Take 1 tablet (20 mg total) by mouth daily. 07/23/22   Malvina Ellen, MD  ferrous sulfate 325 (65 FE) MG EC tablet Take by mouth. 10/02/18   [provider]  ibuprofen  (ADVIL ) 600 MG tablet Take 1 tablet (600 mg total) by mouth every 6 (six) hours as needed for moderate pain. 02/12/20   Landy Honora CROME, PA-C  metFORMIN  (GLUCOPHAGE ) 500 MG tablet Take 1 tablet (500 mg total) by mouth 2 (two) times daily with a meal. 07/09/22   Prosperi, Christian H, PA-C  pantoprazole  (PROTONIX ) 40 MG tablet Take 1 tablet (40 mg total) by mouth daily. 02/13/24   Patsey Lot, MD    Allergies: Patient has no known allergies.    Review of Systems  Gastrointestinal:  Positive for abdominal pain.    Updated Vital Signs BP 124/76   Pulse 61   Temp 98 F (36.7 C) (Oral)   Resp 18   Ht 5' 4 (1.626 m)   Wt 70 kg   LMP 03/19/2019   SpO2  100%   BMI 26.49 kg/m   Physical Exam Vitals reviewed.  Pulmonary:     Effort: Pulmonary effort is normal.  Abdominal:     Tenderness: There is abdominal tenderness.     Comments: Patient with upper abdominal tenderness.  Epigastric to left upper and some right upper quadrant tenderness.  No rebound or guarding.  No hernia.  Neurological:     Mental Status: She is alert.    good good I will get her  (all labs ordered are listed, but only abnormal results are displayed) Labs Reviewed  COMPREHENSIVE METABOLIC PANEL WITH GFR - Abnormal; Notable for the following components:      Result Value   Glucose, Bld 189 (*)    All other components within normal limits  CBC - Abnormal; Notable for the following components:   Hemoglobin 11.4 (*)    HCT 35.9 (*)    RDW 15.9 (*)    All other components within normal limits  LIPASE, BLOOD  TROPONIN T, HIGH SENSITIVITY    EKG: EKG Interpretation Date/Time:  Friday February 13 2024 08:01:40 EDT Ventricular Rate:  71 PR Interval:  146 QRS Duration:  87 QT Interval:  391 QTC Calculation: 425 R Axis:   15  Text Interpretation: Sinus rhythm Abnormal R-wave progression, early transition No  significant change since last tracing Confirmed by Patsey Lot [id number]) on 02/13/2024 8:48:37 AM  Radiology: US  Abdomen Limited RUQ (LIVER/GB) Result Date: 02/13/2024 CLINICAL DATA:  [id number] RUQ abdominal pain [id number] EXAM: ULTRASOUND ABDOMEN LIMITED RIGHT UPPER QUADRANT COMPARISON:  03/09/2021, 02/12/2020 FINDINGS: Gallbladder: A single small gallstone noted. No wall thickening or pericholecystic fluid. No sonographic Murphy's sign noted by sonographer. Common bile duct: Diameter: 2 mm Liver: Increased echogenicity. No focal lesion identified. No intrahepatic biliary ductal dilation. Portal vein is patent on color Doppler imaging with normal direction of blood flow towards the liver. Right Kidney: Partially visualized. No mass. No hydronephrosis or  nephrolithiasis. Other: None. IMPRESSION: 1. Cholecystolithiasis. No changes of acute cholecystitis. 2. Hepatic steatosis. Electronically Signed   By: Rogelia Myers M.D.   On: 02/13/2024 10:30     Procedures   Medications Ordered in the ED  fentaNYL (SUBLIMAZE) injection 50 mcg (50 mcg Intravenous Given 02/13/24 0905)  ondansetron  (ZOFRAN ) injection 4 mg (4 mg Intravenous Given 02/13/24 9095)                                    Medical Decision Making Amount and/or Complexity of Data Reviewed Labs: ordered. Radiology: ordered.  Risk Prescription drug management.   Patient with nausea and epigastric pain.  History of GERD but also history of cholelithiasis.  Will get basic blood work.  Will get ultrasound.  Blood work reassuring.  Ultrasound shows cholelithiasis without cholecystitis.  Also no obstructive disease.  Feeling better after treatment.  Had previously been on proton pump inhibitor but has stopped.  I think patient would benefit from it with PCP or GI.  May need other intervention like EGD.  Potentially H. pylori testing.  Appears stable for discharge at this time however.     Final diagnoses:  Epigastric pain    ED Discharge Orders          Ordered    pantoprazole  (PROTONIX ) 40 MG tablet  Daily        02/13/24 1045               Patsey Lot, MD 02/13/24 1426

## 2024-02-13 NOTE — ED Triage Notes (Signed)
 C/o epigastric pain radiating to back starting this morning with nausea. Hx of similar pain, has been seen previously and diagnosed with GERD, took medicine but stopped it since she had been feeling better. No OTC meds pta.
